# Patient Record
Sex: Female | Born: 2000 | Race: White | Hispanic: No | Marital: Single | Smoking: Never smoker
Health system: Southern US, Community
[De-identification: ages and names within clinical notes are randomized; demographics above are authoritative.]

## PROBLEM LIST (undated history)

## (undated) DIAGNOSIS — M1990 Unspecified osteoarthritis, unspecified site: Secondary | ICD-10-CM

## (undated) DIAGNOSIS — M419 Scoliosis, unspecified: Secondary | ICD-10-CM

## (undated) HISTORY — PX: ADENOIDECTOMY: SUR15

## (undated) HISTORY — DX: Unspecified osteoarthritis, unspecified site: M19.90

## (undated) HISTORY — DX: Scoliosis, unspecified: M41.9

## (undated) HISTORY — PX: TYMPANOSTOMY TUBE PLACEMENT: SHX32

---
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
## 2022-01-27 ENCOUNTER — Ambulatory Visit (INDEPENDENT_AMBULATORY_CARE_PROVIDER_SITE_OTHER): Payer: 59 | Admitting: Family Medicine

## 2022-01-27 ENCOUNTER — Encounter: Payer: Self-pay | Admitting: Family Medicine

## 2022-01-27 VITALS — BP 112/49 | Ht 68.0 in | Wt 168.0 lb

## 2022-01-27 DIAGNOSIS — M546 Pain in thoracic spine: Secondary | ICD-10-CM

## 2022-01-27 HISTORY — DX: Pain in thoracic spine: M54.6

## 2022-01-27 MED ORDER — METHOCARBAMOL 500 MG PO TABS: 500.0000 mg | ORAL_TABLET | Freq: Two times a day (BID) | ORAL | 0 refills | Status: AC | PRN

## 2022-01-27 NOTE — Assessment & Plan Note (Signed)
Patient likely has had a flare of her back pain.  We discussed that I would recommend continuing her home exercises and stretches.  New prescription for muscle relaxer, Robaxin sent today to try for the next 7 to 14 days.  She may also continue use of Tylenol and ibuprofen as needed.  I anticipate she will do well.  Discussed with her as she may need to incorporate her back exercises and to her daily routine once a week.  She verbalized understanding.  Return to clinic if symptoms worsen or fail to improve over the next couple weeks. If no improvement can consider formal physical therapy or other medications.

## 2022-01-27 NOTE — Patient Instructions (Signed)
Muscle strain, continue with your home exercises and stretches for the next 4 to 6 weeks.  New prescription for muscle relaxer, Robaxin sent to your pharmacy today.  Would caution if taking during the day as it may cause drowsiness.  You may also continue with Tylenol and ibuprofen as needed.  Ibuprofen not to exceed 2400 mg in a 24-hour time period.  Follow-up if symptoms worsen or do not improve over the next couple weeks.

## 2022-01-27 NOTE — Progress Notes (Signed)
   Established Patient Office Visit  Subjective   Patient ID: Vanessa Washington, female    DOB: May 10, 2001  Age: 21 y.o. MRN: [id number]  Back pain.  Ms. Vanessa Washington presents today with chief complaint of back pain that worsened on Monday and has been persistent.  She reports on Sunday she did a lot of sitting, sitting at church, sitting at lunch, sitting at the pool and then went home and laid in her bed without much other activity.  She has had similar back pain in the past that resolved with physical therapy.  She has not been doing her home exercises and stretches very consistently as her pain had improved.  She has tried ice, heat, Aleve and Tylenol with minimal relief of her pain.  She describes her pain as an achiness worse with rotation.  She denies any injury or trauma, numbness, tingling, radiation of her pain down her legs or loss of bowel or bladder incontinence.    Objective:     BP (!) 112/49   Ht 5\' 8"  (1.727 m)   Wt 168 lb (76.2 kg)   BMI 25.54 kg/m   Physical Exam Vitals reviewed.  Constitutional:      General: She is not in acute distress.    Appearance: Normal appearance. She is normal weight. She is not toxic-appearing or diaphoretic.  Pulmonary:     Effort: Pulmonary effort is normal.  Neurological:     Mental Status: She is alert.   Thoracic and lumbar spine: No obvious deformity or asymmetry.  No ecchymosis or swelling.  No tenderness to palpation midline thoracic or lumbar spine.  Muscle hypertonicity to paraspinals of lower thoracic and upper lumbar spine.  Full range of motion flexion, extension, rotation.  Worsening of pain with rotation bilaterally.  Strength 5/5 hip flexion, knee flexion and extension.  Patient is able to stand on tip toes and heels.  Normal gait    Assessment & Plan:   Problem List Items Addressed This Visit       Other   Acute bilateral thoracic back pain - Primary    Patient likely has had a flare of muscular back pain.  We  discussed that I would recommend continuing her home exercises and stretches.  New prescription for muscle relaxer, Robaxin sent today to try for the next 7 to 14 days as needed.  She may also continue use of Tylenol and ibuprofen as needed.  I anticipate she will do well.  Discussed with her as she may need to incorporate her back exercises and to her daily routine once a week.  She verbalized understanding.  Return to clinic if symptoms worsen or fail to improve over the next couple weeks. If no improvement can consider formal physical therapy or other medications.      Relevant Medications   methocarbamol (ROBAXIN) 500 MG tablet    Return in about 4 weeks (around 02/24/2022), or sooner if symptoms worsen or fail to improve.    02/26/2022, DO

## 2022-06-01 ENCOUNTER — Ambulatory Visit (INDEPENDENT_AMBULATORY_CARE_PROVIDER_SITE_OTHER): Payer: 59 | Admitting: Sports Medicine

## 2022-06-01 VITALS — BP 110/70 | Ht 68.0 in | Wt 160.0 lb

## 2022-06-01 DIAGNOSIS — M25511 Pain in right shoulder: Secondary | ICD-10-CM | POA: Diagnosis not present

## 2022-06-01 NOTE — Progress Notes (Addendum)
PCP: Clearnce Hasten, PA-C  Subjective:   HPI: Patient is a 21 y.o. female here for evaluation of right shoulder pain.  She reports that her symptoms started about 2 to 3 weeks ago when she noticed more frequent popping of her right shoulder.  A few days later she noticed shooting pain down her arms and the dorsal aspect of her hand but not into her fingers.  She also feels numbness down her entire arm and endorses weakness of her right arm.  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  She denies any trauma or injury.  She does not play any sports.  She has tried Tylenol without relief.  Her symptoms have not affected her sleep. Her symptoms are bearable and she declines any medications for her symptoms at this time.   Allergies  Allergen Reactions   Penicillin G Other (See Comments)    Childhood reaction   Sprintec 28 [Norgestimate-Eth Estradiol]    Amoxicillin Rash    BP 110/70   Ht 5\' 8"  (1.727 m)   Wt 160 lb (72.6 kg)   BMI 24.33 kg/m      07/16/2021    3:21 PM 08/27/2021    8:26 AM  [facility]  Frequency of aerobic exercise (# of days/week) 0 0  Average time in minutes 0 0  Frequency of strengthening activities (# of days/week) 0 0        No data to display             Objective:  Physical Exam:  Gen: NAD, comfortable in exam room  Neck: Negative Spurling, Lhermitte. Supple. No midline c-spine tenderness  Right arm: Normal ROM in supination, pronation. Normal strength. Negative Tinel at cubital tunnel  Right Shoulder: Inspection reveals no obvious deformity, atrophy, or asymmetry b/l. No bruising. No swelling Palpation is normal with no TTP over [facility] joint or bicipital groove b/l. Full ROM in flexion, abduction, internal rotation b/l. External rotation is slightly limited bilaterally NV intact distally  b/l Normal sensation to right arm when compared to left Special Tests:  - Impingement: Neg Hawkins, neers - Supraspinatous: Negative empty can - Infraspinatous/Teres Minor: 5/5 strength with ER - Subscapularis: 5/5 strength with IR - Labrum: Negative Obriens - No painful arc and no drop arm sign -1+ sulcus   Assessment & Plan:  1. Right Shoulder Pain Unclear etiology. Examination only notable for slight decreased ROM in external rotation and increased laxity. This may be the reason she has experienced popping. Exam is not consistent with cervical impingement or cubital tunnel syndrome. She has normal strength and sensation which is reassuring. Will treat conservatively and see how she progresses.  -X-ray right shoulder -Home exercises to strength rotator cuff  -F/u in 3 weeks if no improvement  Patient seen and evaluated with the resident.  I agree with the above plan of care.  Physical exam is reassuring today.  I suspect that she has some inflammation likely secondary to the repetitive nature of her job at [employer].  We did discuss this as being the reason for her symptoms.  I also think she has some mild underlying instability so we will treat with a home exercise program concentrating on strengthening of her internal rotators.  I will plan on checking an x-ray of her right shoulder and will follow-up with her  via MyChart with those results when available.  Will look for symptoms to improve rather quickly within the next 2 to 3 weeks but if this is not the case she will return to the office for reevaluation and consideration of further imaging.  This note was dictated using Dragon naturally speaking software and may contain errors in syntax, spelling, or content which have not been identified prior to signing this note.   Addendum: X-ray reviewed.  It is unremarkable.

## 2022-06-03 ENCOUNTER — Ambulatory Visit
Admission: RE | Admit: 2022-06-03 | Discharge: 2022-06-03 | Disposition: A | Discharge status: Home / Self Care | Payer: 59 | Source: Ambulatory Visit | Attending: Sports Medicine | Admitting: Sports Medicine

## 2022-06-03 ENCOUNTER — Encounter: Payer: Self-pay | Admitting: Radiology

## 2022-06-03 DIAGNOSIS — M25511 Pain in right shoulder: Secondary | ICD-10-CM

## 2022-06-07 ENCOUNTER — Encounter: Payer: Self-pay | Admitting: Sports Medicine

## 2022-07-27 ENCOUNTER — Encounter: Payer: Self-pay | Admitting: Adult Health

## 2022-07-27 ENCOUNTER — Ambulatory Visit: Payer: 59 | Admitting: Adult Health

## 2022-07-27 VITALS — BP 116/73 | HR 82 | Ht 68.0 in | Wt 164.0 lb

## 2022-07-27 DIAGNOSIS — F411 Generalized anxiety disorder: Secondary | ICD-10-CM | POA: Diagnosis not present

## 2022-07-27 MED ORDER — PROPRANOLOL HCL 10 MG PO TABS: 10.0000 mg | ORAL_TABLET | Freq: Three times a day (TID) | ORAL | 2 refills | Status: DC

## 2022-07-27 NOTE — Progress Notes (Signed)
[facility] MD/PA/NP Initial Note  07/27/2022 12:00 PM Vanessa Washington  MRN:  [id number]  Chief Complaint:   HPI:   Patient seen today for initial psychiatric evaluation.   Self referral.  Describes mood today as "ok". Pleasant. Denies tearfulness. Mood symptoms - reports depression - "sometimes". Feels anxious - "most all the time". Reports irritability - mostly due to anxiety. Reports worry, rumination, and over thinking. Reports decreased OCD behaviors - likes things to be "right". Denies irrational and intrusive thoughts. Has worked with a therapist in the past and learned so helpful strategies. Has taken Buspar for the past few years - uncertain if helpful. Willing to consider other options for situational anxiety. Stable interest and motivation. Taking medications as prescribed.  Energy levels stable. Active, does not have a regular exercise routine.  Enjoys some usual interests and activities. Single. Lives with her mom, grandmother and 2 brothers - 2 dogs and an outdoor cat. Spending time with family. Appetite adequate. Weight stable 164 pounds - 68". Sleeps well most nights. Averages 6 hours. Focus and concentration stable. Completing tasks. Managing aspects of household. Works at [employer] - 25 to 30 hours a week. 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  Denies AH or VH. Denies self harm. Denies substance use.  Previous medication trials: Buspar  Visit Diagnosis:    ICD-10-CM   1. Generalized anxiety disorder  F41.1       Past Psychiatric History: Denies psychiatric hospitalization.   Past Medical History:  Past Medical History:  Diagnosis Date   DJD (degenerative joint disease)    Scoliosis      Family Psychiatric History: Denies any family history of mental illness.   Family History: History reviewed. No pertinent family history.  Social History:  Social History   Socioeconomic History   Marital status: Single    Spouse  name: Not on file   Number of children: Not on file   Years of education: Not on file   Highest education level: Not on file  Occupational History   Not on file  Tobacco Use   Smoking status: Not on file   Smokeless tobacco: Not on file  Substance and Sexual Activity   Alcohol use: Not on file   Drug use: Not on file   Sexual activity: Not on file  Other Topics Concern   Not on file  Social History Narrative   Not on file   Social Determinants of Health   Financial Resource Strain: Not on file  Food Insecurity: Not on file  Transportation Needs: Not on file  Physical Activity: Not on file  Stress: Not on file  Social Connections: Not on file    Allergies:  Allergies  Allergen Reactions   Penicillin G Other (See Comments)    Childhood reaction   Sprintec 28 [Norgestimate-Eth Estradiol]    Amoxicillin Rash    Metabolic Disorder Labs: No results found for: "HGBA1C", "MPG" No results found for: "PROLACTIN" No results found for: "CHOL", "TRIG", "HDL", "CHOLHDL", "VLDL", "LDLCALC" No results found for: "TSH"  Therapeutic Level Labs: No results found for: "LITHIUM" No results found for: "VALPROATE" No results found for: "CBMZ"  Current Medications: No current outpatient medications on file.   No current facility-administered medications for this visit.    Medication Side Effects: none  Orders placed this visit:  No orders of the defined types were placed in this encounter.   Psychiatric Specialty Exam:  Review of Systems  Musculoskeletal:  Negative  for gait problem.  Neurological:  Negative for tremors.  Psychiatric/Behavioral:         Please refer to HPI    There were no vitals taken for this visit.There is no height or weight on file to calculate BMI.  General Appearance: Casual and Neat  Eye Contact:  Good  Speech:  Clear and Coherent and Normal Rate  Volume:  Normal  Mood:  Anxious  Affect:  Appropriate and Congruent  Thought Process:  Coherent  and Descriptions of Associations: Intact  Orientation:  Full (Time, Place, and Person)  Thought Content: Logical   Suicidal Thoughts:  No  Homicidal Thoughts:  No  Memory:  WNL  Judgement:  Good  Insight:  Good  Psychomotor Activity:  Normal  Concentration:  Concentration: Good and Attention Span: Good  Recall:  Good  Fund of Knowledge: Good  Language: Good  Assets:  Communication Skills Desire for Improvement Financial Resources/Insurance Housing Intimacy Leisure Time Physical Health Resilience Social Support Talents/Skills Transportation Vocational/Educational  ADL's:  Intact  Cognition: WNL  Prognosis:  Good   Screenings: MDQ  Receiving Psychotherapy: No   Treatment Plan/Recommendations:   Plan:  PDMP reviewed  Add Propranolol 10mg  TID for anxiety  Consider Hydroxyzine  RTC 4 weeks  Patient advised to contact office with any questions, adverse effects, or acute worsening in signs and symptoms.    Aloha Gell, NP

## 2022-07-29 ENCOUNTER — Ambulatory Visit (INDEPENDENT_AMBULATORY_CARE_PROVIDER_SITE_OTHER): Payer: 59 | Admitting: Behavioral Health

## 2022-07-29 DIAGNOSIS — F411 Generalized anxiety disorder: Secondary | ICD-10-CM | POA: Diagnosis not present
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
## 2022-08-02 ENCOUNTER — Encounter: Payer: Self-pay | Admitting: Behavioral Health

## 2022-08-11 ENCOUNTER — Other Ambulatory Visit: Payer: Self-pay

## 2022-08-11 MED ORDER — METHOCARBAMOL 500 MG PO TABS: 500.0000 mg | ORAL_TABLET | Freq: Two times a day (BID) | ORAL | 0 refills | Status: DC | PRN

## 2022-08-13 ENCOUNTER — Ambulatory Visit (INDEPENDENT_AMBULATORY_CARE_PROVIDER_SITE_OTHER): Payer: 59 | Admitting: Behavioral Health

## 2022-08-13 DIAGNOSIS — F411 Generalized anxiety disorder: Secondary | ICD-10-CM

## 2022-08-13 NOTE — Progress Notes (Signed)
[facility] Counselor/Therapist Progress Note  Patient ID: Vanessa Washington, MRN: [id number],    Date: 08/15/2022  Time Spent: 60 minutes  Treatment Type: Psychotherapy  Reported Symptoms: Anxiety  Mental Status Exam:  Appearance:   Casual     Behavior:  Appropriate and Sharing  Motor:  Normal  Speech/Language:   Clear and Coherent  Affect:  Appropriate and Congruent  Mood:  anxious  Thought process:  normal  Thought content:    WNL  Sensory/Perceptual disturbances:    WNL  Orientation:  oriented to person  Attention:  Good  Concentration:  Good  Memory:  WNL  Fund of knowledge:   Good  Insight:    Good  Judgment:   Good  Impulse Control:  Good   Risk Assessment: Danger to Self:  No Self-injurious Behavior: No Danger to Others: No Duty to Warn:no Physical Aggression / Violence:No  Access to Firearms a concern: No  Gang Involvement:No   Subjective:   The patient reports "I feel like I've been doing pretty good. I've had my moments but pretty good". She states she has had trouble with sleeping and being anxious about work. She reports she is compliant with her prescribed medication regimen. She reports her medication Propranolol is helpful. She rated her anxiety at a 4 intially and a 2.75 at the end of the session on a scale of 1 to 10 with 10 being severe. She reports being anxious today due to plans of going out of town this weekend she states "Just a little anxious about how this weekend is going to go". She states plans to attend a wedding this weekend. She reports having thoughts of "I am gonna be stuck in one room with them" that is creating anxiety for her. The patient reports to enjoy her alone time. 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    She reports what makes her anxious is "Being in charge of it all, I know how to do it. Its the big thing of being the one in charge and  having the confidence in myself". The patient reports triggers for anxiety as "Doing new things". She reports coping in the past by encouraging herself to overcome her anxiery and using positive self talk. The patient reports attempting to cope with her anxious thoughts by distracting her thoughts. She identified symptoms experienced when anxious as bitting her lips, becoming annoyed, fidgeting, increase heart rate, pressure in chest, tense muslces, avoidance, neverous tense and wound up, inpatient, and frustrated. The patient created a plan in session to assist her with coping with anxiety while out of town to include the following: utilizing distraction technique, relaxation techniques such as progressive muscle relaxation technique, coloring on the I Pad, homework, and watching a [employer].  The patient identified her take away from today's session as being "Eager to try the techniques". She denied SI/HI, AH/VH.   Counselor conducted check in. Counselor discussed mental health sympotms experienced and requested the patient rate her symptoms. Counselor provided assistance to the patient with processing in session her feelings of anxiety due to her report of planning to go out of town for the weekend with her family. Counselor provided assistance to the patient with developing a plan to assist her with managing her symptoms while she is out of town. Counselor educated the patient on thoughts/feelings/behaviors. Counselor educated the patient on monitoring her self talk.  Counselor questioned symptoms experienced that create anxiety and questioned triggers. Counselor educated the patient on relaxation technique coping strategies such as progressive muscle relaxation, deep breathing, and imagery. Counselor discussed how to challenge irrational thoughts and reframe automatic negative thoughts. Counselor provided the patient with psychoeducational material to assist her with implementing copings strategies  discussed in session today. Counselor assessed for SI/HI, AH/VH.    Interventions: Cognitive Behavioral Therapy  Diagnosis:   ICD-10-CM   1. Generalized anxiety disorder  F41.1       Plan:   Long Term Goal: Develop strategies to reduce symptoms. Short Term Goal: Reduce anxiety and improve coping skills. Objective: Develop strategies for thought distraction when fixating on the future. Objective: Learn two new ways of coping with routine stressors. Objective: Report feeling more positive about self and abilities during therapy sessions.       Jannifer Hick, [facility]

## 2022-08-15 ENCOUNTER — Encounter: Payer: Self-pay | Admitting: Behavioral Health

## 2022-08-24 ENCOUNTER — Ambulatory Visit (INDEPENDENT_AMBULATORY_CARE_PROVIDER_SITE_OTHER): Payer: 59 | Admitting: Adult Health

## 2022-08-24 ENCOUNTER — Encounter: Payer: Self-pay | Admitting: Adult Health

## 2022-08-24 DIAGNOSIS — F411 Generalized anxiety disorder: Secondary | ICD-10-CM

## 2022-08-24 MED ORDER — SERTRALINE HCL 50 MG PO TABS: 50.0000 mg | ORAL_TABLET | Freq: Every day | ORAL | 2 refills | Status: DC

## 2022-08-24 NOTE — Progress Notes (Signed)
Lorane Tomasino [id number] 08/08/2000 22 y.o.  Subjective:   Patient ID:  Vanessa Washington is a 22 y.o. (DOB 2000/10/02) female.  Chief Complaint: No chief complaint on file.   HPI Vanessa Washington presents to the office today for follow-up of GAD.  Describes mood today as "ok". Pleasant. Denies tearfulness. Mood symptoms - reports depression - "situational - missing her grandfather a lot". Feels anxious - "medications has helped reduce it - it's not all the time". Reports irritability - "sometimes". Reports worry, rumination, and over thinking - "I've always done that". Reports decreased OCD behaviors - "likes things to be how they are". Denies irrational and intrusive thoughts. Stating "I've been having some bad moments". Medications has helped with daily issues, but is willing to consider other options.  Stable interest and motivation. Taking medications as prescribed.  Energy levels stable. Active, does not have a regular exercise routine.  Enjoys some usual interests and activities. Single. Lives with her mom, grandmother and 2 brothers - 2 dogs and an outdoor cat. Spending time with family. Appetite adequate. Weight stable 164 pounds - 68". Sleeps well most nights. Averages 5 to 6 hours. Focus and concentration stable. Completing tasks. Managing aspects of household. Works at [employer] - 25 to 30 hours a week. 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  Denies AH or VH. Denies self harm. Denies substance use.  Previous medication trials: Buspar   MDI    Flowsheet Row Counselor from 07/29/2022 in [facility]  Total Score (max 50) 19        Review of Systems:  Review of Systems  Musculoskeletal:  Negative for gait problem.  Neurological:  Negative for tremors.  Psychiatric/Behavioral:         Please refer to HPI    Medications: I have reviewed the patient's current medications.  Current Outpatient  Medications  Medication Sig Dispense Refill   sertraline (ZOLOFT) 50 MG tablet Take 1 tablet (50 mg total) by mouth daily. 30 tablet 2   methocarbamol (ROBAXIN) 500 MG tablet Take 1 tablet (500 mg total) by mouth 2 (two) times daily as needed for muscle spasms. 60 tablet 0   norgestimate-ethinyl estradiol (ORTHO-CYCLEN) 0.25-35 MG-MCG tablet Take 1 tablet by mouth daily.     propranolol (INDERAL) 10 MG tablet Take 1 tablet (10 mg total) by mouth 3 (three) times daily. 90 tablet 2   No current facility-administered medications for this visit.    Medication Side Effects: None  Allergies:  Allergies  Allergen Reactions   Penicillin G Other (See Comments)    Childhood reaction  Unknown   Amoxicillin Rash    Past Medical History:  Diagnosis Date   DJD (degenerative joint disease)    Scoliosis     Past Medical History, Surgical history, Social history, and Family history were reviewed and updated as appropriate.   Please see review of systems for further details on the patient's review from today.   Objective:   Physical Exam:  There were no vitals taken for this visit.  Physical Exam Constitutional:      General: She is not in acute distress. Musculoskeletal:        General: No deformity.  Neurological:     Mental Status: She is alert and oriented to person, place, and time.     Coordination: Coordination normal.  Psychiatric:        Attention and Perception: Attention and perception normal. She does not perceive auditory  or visual hallucinations.        Mood and Affect: Mood normal. Mood is not anxious or depressed. Affect is not labile, blunt, angry or inappropriate.        Speech: Speech normal.        Behavior: Behavior normal.        Thought Content: Thought content normal. Thought content is not paranoid or delusional. Thought content does not include homicidal or suicidal ideation. Thought content does not include homicidal or suicidal plan.        Cognition and  Memory: Cognition and memory normal.        Judgment: Judgment normal.     Comments: Insight intact     Lab Review:  No results found for: "NA", "K", "CL", "CO2", "GLUCOSE", "BUN", "CREATININE", "CALCIUM", "PROT", "ALBUMIN", "AST", "ALT", "ALKPHOS", "BILITOT", "GFRNONAA", "GFRAA"  No results found for: "WBC", "RBC", "HGB", "HCT", "PLT", "MCV", "MCH", "MCHC", "RDW", "LYMPHSABS", "MONOABS", "EOSABS", "BASOSABS"  No results found for: "POCLITH", "LITHIUM"   No results found for: "PHENYTOIN", "PHENOBARB", "VALPROATE", "CBMZ"   .res Assessment: Plan:    Plan:  PDMP reviewed  Continue Propranolol '10mg'$  TID for anxiety  Add Zoloft '50mg'$  daily  Consider Hydroxyzine  RTC 4 weeks  Time spent with patient was 15 minutes. Greater than 50% of face to face time with patient was spent on counseling and coordination of care.    Patient advised to contact office with any questions, adverse effects, or acute worsening in signs and symptoms.   Diagnoses and all orders for this visit:  Generalized anxiety disorder -     sertraline (ZOLOFT) 50 MG tablet; Take 1 tablet (50 mg total) by mouth daily.     Please see After Visit Summary for patient specific instructions.  Future Appointments  Date Time Provider [place]  08/31/2022  1:00 PM Jannifer Hick [facility] CP-CP None  09/14/2022 11:00 AM Jannifer Hick, [facility] None  09/28/2022 11:00 AM Jannifer Hick, [facility] CP-CP None    No orders of the defined types were placed in this encounter.   -------------------------------

## 2022-08-31 ENCOUNTER — Ambulatory Visit (INDEPENDENT_AMBULATORY_CARE_PROVIDER_SITE_OTHER): Payer: 59 | Admitting: Behavioral Health

## 2022-08-31 DIAGNOSIS — F411 Generalized anxiety disorder: Secondary | ICD-10-CM | POA: Diagnosis not present

## 2022-08-31 NOTE — Progress Notes (Signed)
[facility] Counselor/Therapist Progress Note  Patient ID: Vanessa Washington, MRN: [id number],    Date: 08/31/2022  Time Spent: 60 minutes  Treatment Type: Individual Therapy  Reported Symptoms: Anxiety due to experiencing grief.   Mental Status Exam:  Appearance:   Casual     Behavior:  Appropriate and Sharing  Motor:  Normal  Speech/Language:   Clear and Coherent  Affect:  Appropriate and Congruent  Mood:  sad  Thought process:  normal  Thought content:    WNL  Sensory/Perceptual disturbances:    WNL  Orientation:  oriented to person  Attention:  Good  Concentration:  Good  Memory:  WNL  Fund of knowledge:   Good  Insight:    Good  Judgment:   Good  Impulse Control:  Good   Risk Assessment: Danger to Self:  No Self-injurious Behavior: No Danger to Others: No Duty to Warn:no Physical Aggression / Violence:No  Access to Firearms a concern: No  Gang Involvement:No   Subjective:   The patient states since last being seen by this counselor "The first week was kind of rough, but this past week has been better". She reports she is medication compliant and states recently starting a new medication Zoloft and reports to take Propranolol as needed.  She reported no current side effects experienced. She reports her trip went well she expressed gratitude with seeing a family member she hadn't seen in a while. The patient reports experiencing anxiety due to missing her deceased grandfather. She identified triggers of sadness as talking about him and listening to music he used to like. She identified fond memories of him and reflected on the past. The patient states she is unaware when she is the saddest. She reports what she missed the most about her grandfather as "Just him being there, because I would always hang out with him. He brought our family close together because he was the mediator". The patient reports honoring her deceased grandfather by burying him in the urn  garden he created. 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   She reports to cope with his death by using humor. The patient expressed gratitude with being proud of the person her grandfather was and the person he made her. She identified current emotions as "A little lonely, a light sadness, mad at the fact he died, feeling quiet". The patient reports belief that she is in the acceptance stage of her grandfathers death. The patient reports "I have days that I am depressed about it, but not a constant state". The patient reports her current trigger for missing her grandfather is she is about to graduate with her Associates Degree in [employer] and Business.  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 The patient reports "Its a sadness I feel, but I can still function". She identified what she would never forget about her grandfather she identified him as her father figure. The patient identified her take away from today's session as "I know I can be sad, but just trying to remember the good moments when I get sad ". The patient was informed this counselor's time is limited at [place] the patient requested to continue with therapy with another staff after this counselor leave. Patient denied SI/HI, AH/VH.   Counselor conducted check in. Counselor discussed mental health symptoms experienced. Counselor utilized reflective listening and validated the patients  concerns while she reminisced on missing her deceased grandfather. Counselor discussed the stages of grief and requested feedback from the patient on what stage she is in. Counselor facilitated an activity in session and requested the patient identify her current emotions on a Feelings Wheel. Counselor informed the patient her time is limited with [facility] and questioned the patients interest with continuing with  therapy. Counselor assessed for SI/HI,AH/VH.     Interventions: Grief Therapy  Diagnosis:   ICD-10-CM   1. Generalized anxiety disorder  F41.1       Plan:   Long Term Goal: Develop strategies to reduce symptoms. Short Term Goal: Reduce anxiety and improve coping skills. Objective: Develop strategies for thought distraction when fixating on the future. Objective: Learn two new ways of coping with routine stressors. Objective: Report feeling more positive about self and abilities during therapy sessions.      Jannifer Hick, [facility]

## 2022-09-02 ENCOUNTER — Encounter: Payer: Self-pay | Admitting: Behavioral Health

## 2022-09-14 ENCOUNTER — Ambulatory Visit (INDEPENDENT_AMBULATORY_CARE_PROVIDER_SITE_OTHER): Payer: 59 | Admitting: Behavioral Health

## 2022-09-14 ENCOUNTER — Encounter: Payer: Self-pay | Admitting: Behavioral Health

## 2022-09-14 DIAGNOSIS — F411 Generalized anxiety disorder: Secondary | ICD-10-CM | POA: Diagnosis not present

## 2022-09-14 NOTE — Progress Notes (Signed)
      [facility] Counselor/Therapist Progress Note  Patient ID: Vanessa Washington, MRN: [id number],    Date: 09/14/2022  Time Spent: 60 minutes   Treatment Type: Individual Therapy  Reported Symptoms:  Anxiety   Mental Status Exam:  Appearance:   Casual     Behavior:  Appropriate and Sharing  Motor:  Normal  Speech/Language:   Clear and Coherent  Affect:  Appropriate, Congruent, and Tearful  Mood:  anxious and sad  Thought process:  normal  Thought content:    WNL  Sensory/Perceptual disturbances:    WNL  Orientation:  oriented to person  Attention:  Good  Concentration:  Good  Memory:  WNL  Fund of knowledge:   Good  Insight:    Good  Judgment:   Good  Impulse Control:  Good   Risk Assessment: Danger to Self:  No Self-injurious Behavior: No Danger to Others: No Duty to Warn:no Physical Aggression / Violence:No  Access to Firearms a concern: No  Gang Involvement:No   Subjective:   The patient reports doing "Pretty good" since last being seen. The patient expressed interest in discussing a current family issue. The patient discussed concerns of a custody dispute involving her younger sibling. She reports experiencing anxiety due to what is happening. She identified physical symptoms of anxiety as tightness of her chest. She reports feeling frustrated with the custody dispute and having belief that she is unable to help as she desires. The patient reports feeling mistreated by her moms ex-husband. The patient reports having knowledge that what her mother's ex-husband say about her is not true. She states concerns that he is attempting to damage her relationship with her younger brother. The patient states a child protective services (CPS) report has been filed in the past based off of false allegations. The patient reports interest with establishing a relationship with her younger brother in regards to  "I don't want him to hate me". She reports belief that in order for  her relationship to improve with her younger brother he will need to grow older. She rated her anxiety at a 5 today on a scale of 1 to 10 with 10 being severe. The patient denied SI/HI, AH/VH. The patient reported interest with continuing to receive therapy with [facility] and was informed what therapist she will transition to. The patient was receptive.   Counselor conducted check in. Counselor discussed mental health symptoms experienced and requested for the patient to rate the symptoms. Counselor explored what factual evidence did the patient have to conclude her relationship with her younger sibling is being damaged by his father. Counselor discussed cognitive distortions and educated the patient on emotional reasoning. Counselor utilized reflective listening and validated the patient. Counselor provided assistance to the patient with exploring her family dynamics. Counselor questioned the patients interest with improving her relationship with her younger sibling. Counselor assessed for SI/HI, AH/VH.  Interventions: Motivational Interviewing  Diagnosis:   ICD-10-CM   1. Generalized anxiety disorder  F41.1       Plan:   Long Term Goal: Develop strategies to reduce symptoms. Short Term Goal: Reduce anxiety and improve coping skills. Objective: Develop strategies for thought distraction when fixating on the future. Objective: Learn two new ways of coping with routine stressors. Objective: Report feeling more positive about self and abilities during therapy sessions.      Jannifer Hick, [facility]

## 2022-09-15 ENCOUNTER — Other Ambulatory Visit: Payer: Self-pay | Admitting: Adult Health

## 2022-09-15 DIAGNOSIS — F411 Generalized anxiety disorder: Secondary | ICD-10-CM

## 2022-09-23 ENCOUNTER — Ambulatory Visit (INDEPENDENT_AMBULATORY_CARE_PROVIDER_SITE_OTHER): Payer: Self-pay | Admitting: Adult Health

## 2022-09-23 DIAGNOSIS — F489 Nonpsychotic mental disorder, unspecified: Secondary | ICD-10-CM

## 2022-09-23 NOTE — Progress Notes (Signed)
Patient no show appointment. ? ?

## 2022-09-27 ENCOUNTER — Encounter: Payer: Self-pay | Admitting: Adult Health

## 2022-09-27 ENCOUNTER — Ambulatory Visit (INDEPENDENT_AMBULATORY_CARE_PROVIDER_SITE_OTHER): Payer: 59 | Admitting: Adult Health

## 2022-09-27 DIAGNOSIS — F411 Generalized anxiety disorder: Secondary | ICD-10-CM

## 2022-09-27 MED ORDER — SERTRALINE HCL 50 MG PO TABS: 50.0000 mg | ORAL_TABLET | Freq: Every day | ORAL | 2 refills | Status: DC

## 2022-09-27 NOTE — Progress Notes (Signed)
Shadell Stewart [id number] 04-06-2001 22 y.o.  Subjective:   Patient ID:  Vanessa Washington is a 22 y.o. (DOB 07-13-2000) female.  Chief Complaint: No chief complaint on file.   HPI Vanessa Washington presents to the office today for follow-up of GAD.  Describes mood today as "ok". Pleasant. Denies tearfulness. Mood symptoms - reports decreased depression Reports decreased anxiety with addition of Zoloft. Denies irritability. Reports decreased worry, rumination, and over thinking. Reports decreased OCD behaviors. Denies irrational and intrusive thoughts. Stating "I've been feeling better". Feels like the addition of Zoloft has been helpful. Stable interest and motivation. Taking medications as prescribed.  Energy levels stable. Active, does not have a regular exercise routine.  Enjoys some usual interests and activities. Single. Lives with her mom, grandmother and 2 brothers - 2 dogs and an outdoor cat. Spending time with family. Appetite adequate. Weight stable 164 pounds - 68". Sleeps well most nights. Averages 5 to 6 hours. Focus and concentration stable. Completing tasks. Managing aspects of household. Works at [employer] - 25 to 30 hours a week. 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  Denies AH or VH. Denies self harm. Denies substance use.   MDI    Flowsheet Row Counselor from 07/29/2022 in [facility]  Total Score (max 50) 19        Review of Systems:  Review of Systems  Musculoskeletal:  Negative for gait problem.  Neurological:  Negative for tremors.  Psychiatric/Behavioral:         Please refer to HPI    Medications: I have reviewed the patient's current medications.  Current Outpatient Medications  Medication Sig Dispense Refill   methocarbamol (ROBAXIN) 500 MG tablet Take 1 tablet (500 mg total) by mouth 2 (two) times daily as needed for muscle spasms. 60  tablet 0   norgestimate-ethinyl estradiol (ORTHO-CYCLEN) 0.25-35 MG-MCG tablet Take 1 tablet by mouth daily.     propranolol (INDERAL) 10 MG tablet Take 1 tablet (10 mg total) by mouth 3 (three) times daily. 90 tablet 2   sertraline (ZOLOFT) 50 MG tablet Take 1 tablet (50 mg total) by mouth daily. 30 tablet 2   No current facility-administered medications for this visit.    Medication Side Effects: None  Allergies:  Allergies  Allergen Reactions   Penicillin G Other (See Comments)    Childhood reaction  Unknown   Amoxicillin Rash    Past Medical History:  Diagnosis Date   DJD (degenerative joint disease)    Scoliosis     Past Medical History, Surgical history, Social history, and Family history were reviewed and updated as appropriate.   Please see review of systems for further details on the patient's review from today.   Objective:   Physical Exam:  There were no vitals taken for this visit.  Physical Exam Constitutional:      General: She is not in acute distress. Musculoskeletal:        General: No deformity.  Neurological:     Mental Status: She is alert and oriented to person, place, and time.     Coordination: Coordination normal.  Psychiatric:        Attention and Perception: Attention and perception normal. She does not perceive auditory or visual hallucinations.        Mood and Affect: Mood normal. Mood is not anxious or depressed. Affect is not labile, blunt, angry or inappropriate.  Speech: Speech normal.        Behavior: Behavior normal.        Thought Content: Thought content normal. Thought content is not paranoid or delusional. Thought content does not include homicidal or suicidal ideation. Thought content does not include homicidal or suicidal plan.        Cognition and Memory: Cognition and memory normal.        Judgment: Judgment normal.     Comments: Insight intact     Lab Review:  No results found for: "NA", "K", "CL", "CO2",  "GLUCOSE", "BUN", "CREATININE", "CALCIUM", "PROT", "ALBUMIN", "AST", "ALT", "ALKPHOS", "BILITOT", "GFRNONAA", "GFRAA"  No results found for: "WBC", "RBC", "HGB", "HCT", "PLT", "MCV", "MCH", "MCHC", "RDW", "LYMPHSABS", "MONOABS", "EOSABS", "BASOSABS"  No results found for: "POCLITH", "LITHIUM"   No results found for: "PHENYTOIN", "PHENOBARB", "VALPROATE", "CBMZ"   .res Assessment: Plan:    Plan:  PDMP reviewed  Propranolol 10mg  TID for anxiety  Zoloft 50mg  daily  Consider Hydroxyzine  RTC 3 months  Time spent with patient was 15 minutes. Greater than 50% of face to face time with patient was spent on counseling and coordination of care.    Patient advised to contact office with any questions, adverse effects, or acute worsening in signs and symptoms.  There are no diagnoses linked to this encounter.   Please see After Visit Summary for patient specific instructions.  Future Appointments  Date Time Provider [place]  09/28/2022 11:00 AM Jannifer Hick, [facility] CP-CP None    No orders of the defined types were placed in this encounter.   -------------------------------

## 2022-09-28 ENCOUNTER — Ambulatory Visit (INDEPENDENT_AMBULATORY_CARE_PROVIDER_SITE_OTHER): Payer: 59 | Admitting: Behavioral Health

## 2022-09-28 ENCOUNTER — Encounter: Payer: Self-pay | Admitting: Behavioral Health

## 2022-09-28 DIAGNOSIS — F411 Generalized anxiety disorder: Secondary | ICD-10-CM | POA: Diagnosis not present

## 2022-09-28 NOTE — Progress Notes (Signed)
[facility] Counselor/Therapist Progress Note  Patient ID: Vanessa Washington, MRN: [id number],    Date: 09/28/2022  Time Spent: 40 minutes  Treatment Type: Individual Therapy  Reported Symptoms: Mild anxiety  Mental Status Exam:  Appearance:   Casual     Behavior:  Appropriate and Sharing  Motor:  Normal  Speech/Language:   Clear and Coherent  Affect:  Appropriate and Congruent  Mood:  normal  Thought process:  normal  Thought content:    WNL  Sensory/Perceptual disturbances:    WNL  Orientation:  oriented to person  Attention:  Good  Concentration:  Good  Memory:  WNL  Fund of knowledge:   Good  Insight:    Good  Judgment:   Good  Impulse Control:  Good   Risk Assessment: Danger to Self:  No Self-injurious Behavior: No Danger to Others: No Duty to Warn:no Physical Aggression / Violence:No  Access to Firearms a concern: No  Gang Involvement:No   Subjective:   The patient presented with cooperative behavior during this session. The patient reports doing good since last being seen. She reports adhering to her prescribed medication regimen and states the medicine is helpful. She denied experiencing any side effects. She reports daily medication prescribed is Zoloft and Propranolol as needed. She rated anxiety at a 1 today on a scale of 0 to 10 with 10 being severe. The patient states "Since I started taking the medicine I haven't really had any anxiety. I have been doing really good". The patient states her family dynamics have improved since she has began following her medication regimen. She reports things went well when she spent time with her family for the Easter holiday.  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 She expressed gratitude with observing her progress in life. She reports receiving management training to help her adjust into her new role. She reports utilizing positive self talk to assist her with managing her mood stability. She  expressed gratitude with soon graduating with her Associates Degree in [employer] from [employer]. She identified a plan for herself regarding her future as learning the funeral home business. She identified having insight and knowledge on what her interest are in having a career. She denied SI/HI, AH/VH. The patient states interest with continuing to receive therapy with [facility]. She is receptive of the therapist she will transition to she states to have a future appointment scheduled that she plans to attend.  Counselor conducted check in. Counselor utilized open ended questions, reflective listening and validated the patient. Counselor discussed mental health symptoms experienced. Counselor requested for the patient to rate her symptoms. Counselor questioned medication compliance and side effects experienced. Counselor reviewed the patients therapeutic goal for treatment and discussed her progress with implementing effective changes. Counselor discussed the patients interest with continuing to receive therapy due to this counselor will be leaving [facility]. Counselor assessed for SI/HI, AH/VH.    Interventions: Motivational Interviewing  Diagnosis:   ICD-10-CM   1. Generalized anxiety disorder  F41.1       Plan:   Long Term Goal: Develop strategies to reduce symptoms. Short Term Goal: Reduce anxiety and improve coping skills. Objective: Develop strategies for thought distraction when fixating on the future. Objective: Learn two new ways of coping with routine stressors. Objective: Report feeling more positive about self and abilities during therapy sessions.     Jannifer Hick, [facility]

## 2022-10-12 ENCOUNTER — Ambulatory Visit (INDEPENDENT_AMBULATORY_CARE_PROVIDER_SITE_OTHER): Payer: 59 | Admitting: Psychiatry

## 2022-10-12 DIAGNOSIS — F411 Generalized anxiety disorder: Secondary | ICD-10-CM | POA: Diagnosis not present

## 2022-10-12 NOTE — Progress Notes (Signed)
[facility] Counselor/Therapist Progress Note  Patient ID: Vanessa Washington, MRN: [id number],    Date: 10/12/2022  Time Spent: 50 minutes   Treatment Type: Individual Therapy  Reported Symptoms: anxiety, some unresolved family issues from past, denies any SI  Mental Status Exam:  Appearance:   Casual     Behavior:  Appropriate, Sharing, and Motivated  Motor:  Normal  Speech/Language:   Clear and Coherent  Affect:  anxious  Mood:  anxious  Thought process:  goal directed  Thought content:    WNL  Sensory/Perceptual disturbances:    WNL  Orientation:  oriented to person, place, time/date, situation, day of week, month of year, year, and stated date of October 12, 2022  Attention:  Good  Concentration:  Good  Memory:  WNL  Fund of knowledge:   Good  Insight:    Good  Judgment:   Good  Impulse Control:  Good   Risk Assessment: Danger to Self:  No Self-injurious Behavior: No Danger to Others: No Duty to Warn:no Physical Aggression / Violence:No  Access to Firearms a concern: No  Gang Involvement:No   Subjective:   Patient transferred from previous therapist who was moving away and left practice. States "My main therapy goal is to manage my anxiety better". Was in therapy and made a bit of progress but was evaluated for meds and is now doing better on the Zoloft. Works a difficult schedule getting up at 3:15 and had to be at work by 5:15am and gets off at 2:15pm. Also has to close on some days and hours are adjusted. Just completing [employer] (Agriculture/business but not sure what field she really wants to go into as a career.) Exhausted with school and wants a break to decide what's next steps for her. Feels meds have helped but still wanting to work in therapy mainly "about the relationship with my dad and why he left the family, me and my siblings". "When I was little I was mad at him but now am not mad but just wonder a lot, and I really don't see  him as my dad." Wishes she could talk with dad about some things but not our of anger. Unsure how or when she wants to do that. Has been close to a former school admin and her husband/minister at that time.  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  Patient does show good insight into her situation and her life experiences and wanting to participate in therapy to talk through more these and especially issues relating to her father that had left the family earlier in his marriage to their mother.  Based on her work schedule, she is scheduling within the month to return for her next session.  Will call before that time if needed.  Interventions: Cognitive Behavioral Therapy and Ego-Supportive   Long Term Goal: Develop strategies to reduce symptoms. Short Term Goal: Reduce anxiety and improve coping skills. Objective: Develop strategies for thought distraction when fixating on the future. Objective: Learn two new ways of coping with routine stressors. Objective: Report feeling more positive about self and abilities during therapy sessions.   Diagnosis:   ICD-10-CM   1. Generalized anxiety disorder  F41.1      Plan: Patient attended session today as she is transitioning from a previous therapist who was relocating to another city.  Patient very open today and sharing  a lot of issues from her past and her present, and picking up especially on some issues that are related to her father leaving the family in earlier years as parents divorced and also some current anxieties regarding work and potentially choosing a vocation of interest to her.  Seems especially has some further issues to work out regarding her biological father.  Does feel that her medication is helping her with her anxiety.  Is motivated and scheduling a return appointment today and we will follow-up with patient at that time.  Goal review and  progress/challenges noted with patient.  Next appointment within 2 to 3 weeks.  This record has been created using [employer].  Chart creation errors have been sought, but may not always have been located and corrected.  Such creation errors do not reflect on the standard of medical care provided.   Mathis Fare, LCSW

## 2022-10-15 ENCOUNTER — Encounter (HOSPITAL_BASED_OUTPATIENT_CLINIC_OR_DEPARTMENT_OTHER): Payer: Self-pay

## 2022-10-15 ENCOUNTER — Emergency Department (HOSPITAL_BASED_OUTPATIENT_CLINIC_OR_DEPARTMENT_OTHER)
Admission: EM | Admit: 2022-10-15 | Discharge: 2022-10-15 | Disposition: A | Discharge status: Home / Self Care | Payer: 59 | Attending: Emergency Medicine | Admitting: Emergency Medicine

## 2022-10-15 ENCOUNTER — Emergency Department (HOSPITAL_BASED_OUTPATIENT_CLINIC_OR_DEPARTMENT_OTHER): Payer: 59

## 2022-10-15 ENCOUNTER — Other Ambulatory Visit: Payer: Self-pay

## 2022-10-15 DIAGNOSIS — R079 Chest pain, unspecified: Secondary | ICD-10-CM | POA: Diagnosis present

## 2022-10-15 MED ORDER — OMEPRAZOLE 20 MG PO CPDR: 20.0000 mg | DELAYED_RELEASE_CAPSULE | Freq: Every day | ORAL | 1 refills | Status: DC

## 2022-10-15 MED ORDER — ALUM & MAG HYDROXIDE-SIMETH 200-200-20 MG/5ML PO SUSP
30.0000 mL | Freq: Once | ORAL | Status: AC
  Administered 2022-10-15: 30 mL via ORAL
  Filled 2022-10-15: qty 30

## 2022-10-15 MED ORDER — SUCRALFATE 1 G PO TABS: 1.0000 g | ORAL_TABLET | Freq: Four times a day (QID) | ORAL | 0 refills | Status: DC | PRN

## 2022-10-15 MED ORDER — ONDANSETRON 4 MG PO TBDP
4.0000 mg | ORAL_TABLET | Freq: Once | ORAL | Status: AC
  Administered 2022-10-15: 4 mg via ORAL
  Filled 2022-10-15: qty 1

## 2022-10-15 NOTE — ED Triage Notes (Signed)
Woke up ~1 hour pta with sudden generalized chest pains described as indigestion like pains.

## 2022-10-15 NOTE — Discharge Instructions (Signed)
You were evaluated in the Emergency Department and after careful evaluation, we did not find any emergent condition requiring admission or further testing in the hospital.  Your exam/testing today is overall reassuring.  Suspect your symptoms are related to acid reflux.  Take the omeprazole and Carafate medications as we discussed.  We talked about your EKG findings.  Please follow-up with cardiology for further testing or evaluation.  Please return to the Emergency Department if you experience any worsening of your condition.   Thank you for allowing [place] to be a part of your care.

## 2022-10-15 NOTE — ED Provider Notes (Signed)
[facility] Emergency Department Provider Note MRN:  [id number]  Arrival date & time: 10/15/22     Chief Complaint   Chest Pain   History of Present Illness   Vanessa Washington is a 22 y.o. year-old female with no pertinent past medical history presenting to the ED with chief complaint of chest pain.  Central burning chest pain with radiation into the throat.  Woke her up from sleep.  No shortness of breath, no recent leg pain or swelling, no abdominal pain  Review of Systems  A thorough review of systems was obtained and all systems are negative except as noted in the HPI and PMH.   Patient's Health History    Past Medical History:  Diagnosis Date   DJD (degenerative joint disease)    Scoliosis     History reviewed. No pertinent surgical history.  History reviewed. No pertinent family history.  Social History   Socioeconomic History   Marital status: Single    Spouse name: Not on file   Number of children: Not on file   Years of education: Not on file   Highest education level: Not on file  Occupational History   Not on file  Tobacco Use   Smoking status: Never   Smokeless tobacco: Never  Substance and Sexual Activity   Alcohol use: Never   Drug use: Never   Sexual activity: Not on file  Other Topics Concern   Not on file  Social History Narrative   Not on file   Social Determinants of Health   Financial Resource Strain: Not on file  Food Insecurity: Not on file  Transportation Needs: Not on file  Physical Activity: Not on file  Stress: Not on file  Social Connections: Not on file  Intimate Partner Violence: Not on file     Physical Exam   Vitals:   10/15/22 0400  BP: 129/76  Pulse: 95  Resp: (!) 22  Temp: 98.3 F (36.8 C)  SpO2: 100%    CONSTITUTIONAL: Well-appearing, NAD NEURO/PSYCH:  Alert and oriented x 3, no focal deficits EYES:  eyes equal and reactive ENT/NECK:  no LAD, no JVD CARDIO: Regular rate,  well-perfused, normal S1 and S2 PULM:  CTAB no wheezing or rhonchi GI/GU:  non-distended, non-tender MSK/SPINE:  No gross deformities, no edema SKIN:  no rash, atraumatic   *Additional and/or pertinent findings included in MDM below  Diagnostic and Interventional Summary    EKG Interpretation  Date/Time:  Friday October 15 2022 04:38:23 EDT Ventricular Rate:  75 PR Interval:  152 QRS Duration: 121 QT Interval:  371 QTC Calculation: 415 R Axis:   53 Text Interpretation: Sinus rhythm Probable left atrial enlargement Right bundle branch block Confirmed by Kennis Carina [phone]) on 10/15/2022 4:44:21 AM       Labs Reviewed - No data to display  DG Chest 2 View  Final Result      Medications  alum & mag hydroxide-simeth (MAALOX/MYLANTA) 200-200-20 MG/5ML suspension 30 mL (30 mLs Oral Given 10/15/22 0415)  ondansetron (ZOFRAN-ODT) disintegrating tablet 4 mg (4 mg Oral Given 10/15/22 0415)     Procedures  /  Critical Care Procedures  ED Course and Medical Decision Making  Initial Impression and Ddx Burning pain while sleeping radiation into the throat, favoring acid reflux.  Other considerations include pneumothorax.  Nothing to suggest PE.  Past medical/surgical history that increases complexity of ED encounter: None  Interpretation of Diagnostics I personally reviewed the EKG and my interpretation  is as follows: Sinus rhythm, right bundle branch block without prior for comparison  Chest x-ray unremarkable.  Patient Reassessment and Ultimate Disposition/Management     Patient with significant relief after GI cocktail.  Highly doubt emergent process such as PE or ACS or dissection.  Doubt that the bundle branch block is an acute issue.  It does not seem consistent with Brugada or any other dangerous arrhythmia.  Still given the bundle branch block in a 22 year old, will refer to cardiology for possible echo.  Patient management required discussion with the following services  or consulting groups:  None  Complexity of Problems Addressed Acute complicated illness or Injury  Additional Data Reviewed and Analyzed Further history obtained from: Further history from spouse/family member  Additional Factors Impacting ED Encounter Risk Prescriptions  Elmer Sow. Pilar Plate, MD [facility] Emergency Medicine [facility] [email or web address]  Final Clinical Impressions(s) / ED Diagnoses     ICD-10-CM   1. Chest pain, unspecified type  R07.9 Ambulatory referral to Cardiology      ED Discharge Orders          Ordered    Ambulatory referral to Cardiology        10/15/22 0510    sucralfate (CARAFATE) 1 g tablet  4 times daily PRN        10/15/22 0510    omeprazole (PRILOSEC) 20 MG capsule  Daily        10/15/22 0510             Discharge Instructions Discussed with and Provided to Patient:     Discharge Instructions      You were evaluated in the Emergency Department and after careful evaluation, we did not find any emergent condition requiring admission or further testing in the hospital.  Your exam/testing today is overall reassuring.  Suspect your symptoms are related to acid reflux.  Take the omeprazole and Carafate medications as we discussed.  We talked about your EKG findings.  Please follow-up with cardiology for further testing or evaluation.  Please return to the Emergency Department if you experience any worsening of your condition.   Thank you for allowing [place] to be a part of your care.       Sabas Sous, MD 10/15/22 [phone]

## 2022-10-21 ENCOUNTER — Other Ambulatory Visit: Payer: Self-pay | Admitting: Adult Health

## 2022-10-21 DIAGNOSIS — F411 Generalized anxiety disorder: Secondary | ICD-10-CM

## 2022-10-26 ENCOUNTER — Ambulatory Visit: Payer: 59 | Admitting: Psychiatry

## 2022-11-09 ENCOUNTER — Ambulatory Visit (INDEPENDENT_AMBULATORY_CARE_PROVIDER_SITE_OTHER): Payer: 59 | Admitting: Psychiatry

## 2022-11-09 DIAGNOSIS — F411 Generalized anxiety disorder: Secondary | ICD-10-CM

## 2022-11-09 NOTE — Progress Notes (Signed)
[facility] Counselor/Therapist Progress Note  Patient ID: Vanessa Washington, MRN: [id number],    Date: 11/09/2022  Time Spent: 55 minutes   Treatment Type: Individual Therapy  Reported Symptoms: anxiety (improving)  Mental Status Exam:  Appearance:   Casual     Behavior:  Appropriate, Sharing, and Motivated  Motor:  Normal  Speech/Language:   Clear and Coherent  Affect:  anxious  Mood:  anxious  Thought process:  goal directed  Thought content:    WNL  Sensory/Perceptual disturbances:    WNL  Orientation:  oriented to person, place, time/date, situation, day of week, month of year, year, and stated date of Nov 09, 2022  Attention:  Good  Concentration:  Good  Memory:  WNL  Fund of knowledge:   Good  Insight:    Good  Judgment:   Good  Impulse Control:  Good   Risk Assessment: Danger to Self:  No Self-injurious Behavior: No Danger to Others: No Duty to Warn:no Physical Aggression / Violence:No  Access to Firearms a concern: No  Gang Involvement:No   Subjective:   Patient in session today reporting anxiety and it is improving. Graduated from college since last appt. Did see her dad who she does not live with and has has strained relationship. Dad left afterwards and he and GF went to the beach. Not much time spent with him and he may have not felt comfortable. Have always felt mad or sad that Dad wasn't really involved with her life very actively, and "not really sure how I feel about him or all of the times of him not being there for me much." Processed a lot of these feelings very openly today which seemed to be helpful to patient, and feels she needs to continue to do this further. Trying to "not force myself into it."  States that she does have more to talk about in reference to relationship or lack of relationship with dad and is also finding that she is doing some "letting go".  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 Good insight today.  Based on her work schedule, is scheduling again within a month and knows she can call before that time if needed.  Interventions: Cognitive Behavioral Therapy and Ego-Supportive  Long Term Goal: Develop strategies to reduce symptoms. Short Term Goal: Reduce anxiety and improve coping skills. Objective: Develop strategies for thought distraction when fixating on the future. Objective: Learn two new ways of coping with routine stressors. Objective: Report feeling more positive about self and abilities during therapy sessions.   Diagnosis:   ICD-10-CM   1. Generalized anxiety disorder  F41.1      Plan: Patient in for session today showing good participation and motivation focusing on her anxiety which is improving some, and also some personal and family issues that are very much related to her anxiety.  Is making progress and needs to continue her work with goal-directed behaviors to keep moving in a forward direction. Encouraged patient in her practice of more positive and self affirming thoughts and behaviors as noted in session including: Staying in touch with supportive people, remaining on her prescribed medications, look for more positives versus negatives daily, refrain from assuming negatives or worst-case scenarios, use of encouraging/positive self talk, reduce overthinking and over analyzing which she has already began to do,  letting go of things including guilt from the past if they contribute to holding her back from moving forward, and recognizing the strengths that she shows as she works with goal-directed behaviors to move in a direction that supports increased confidence, improved emotional health, and overall wellbeing.  Goal review and progress/challenges noted with patient.  Next appointment within 2 to 3 weeks.   Mathis Fare,  LCSW

## 2022-12-08 ENCOUNTER — Ambulatory Visit (INDEPENDENT_AMBULATORY_CARE_PROVIDER_SITE_OTHER): Payer: 59 | Admitting: Psychiatry

## 2022-12-08 DIAGNOSIS — F411 Generalized anxiety disorder: Secondary | ICD-10-CM | POA: Diagnosis not present

## 2022-12-08 NOTE — Progress Notes (Signed)
      [facility] Counselor/Therapist Progress Note  Patient ID: Vanessa Washington, MRN: [id number],    Date: 12/08/2022  Time Spent: 55 minutes   Treatment Type: Individual Therapy  Reported Symptoms: anxiety, some sadness  Mental Status Exam:  Appearance:   Casual     Behavior:  Appropriate, Sharing, and Motivated  Motor:  Normal  Speech/Language:   Clear and Coherent  Affect:  Anxious   Mood:  anxious  Thought process:  goal directed  Thought content:    WNL  Sensory/Perceptual disturbances:    WNL  Orientation:  oriented to person, place, time/date, situation, day of week, month of year, year, and stated date of December 08, 2022  Attention:  Good  Concentration:  Good  Memory:  WNL  Fund of knowledge:   Good  Insight:    Good  Judgment:   Good  Impulse Control:  Good   Risk Assessment: Danger to Self:  No Self-injurious Behavior: No Danger to Others: No Duty to Warn:no Physical Aggression / Violence:No  Access to Firearms a concern: No  Gang Involvement:No   Subjective:  Patient in session today reporting anxiety especially trip to [place] family trip. Lots of stressors due to differences amongst family members and patient worried how things may go as relationship with dad is strained and distant. Processed this more in session today which seemed helpful for patient. Also shared and processed her sadness and missing grandfather with whom she was close and "just sad that he is not longer here".  Really misses him at this time of recent graduation and in midst of other family "issues". (Not all details included in this note due to patient privacy needs.)  Patient showing good strength to make the best of some situations within the family and acknowledges this is what is also probably best for her as well.  Interventions: Cognitive Behavioral Therapy and Ego-Supportive  Long Term Goal: Develop strategies to reduce symptoms. Short Term Goal: Reduce anxiety and improve  coping skills. Objective: Develop strategies for thought distraction when fixating on the future. Objective: Learn two new ways of coping with routine stressors. Objective: Report feeling more positive about self and abilities during therapy sessions.   Diagnosis:   ICD-10-CM   1. Generalized anxiety disorder  F41.1      Plan:  Patient in for session today actively participating and motivated as she works further on her anxiety, losses, and other family issues. Is making progress and needs to continue work with goal directed behaviors in order to move in a forward direction. Encouraged patient and practicing more positive and self affirming thoughts and behaviors as noted in session including: Remaining in touch with supportive people, staying on her prescribed medications, look for more positives versus negatives daily, refrain from assuming negatives or worst-case scenarios, use of encouraging/positive self talk, reduce overthinking and over analyzing, letting go of things including guilt from the past as they contribute to holding her back from moving forward, and realize the strengths she shows working with goal-directed behaviors to move in a direction that supports increased confidence, improved emotional health, and her outlook.  Goal review and progress/challenges noted with patient.  Next appointment within 3 to 4 weeks.   Mathis Fare, LCSW

## 2022-12-09 DIAGNOSIS — M1990 Unspecified osteoarthritis, unspecified site: Secondary | ICD-10-CM | POA: Insufficient documentation

## 2022-12-09 DIAGNOSIS — M419 Scoliosis, unspecified: Secondary | ICD-10-CM | POA: Insufficient documentation

## 2022-12-21 ENCOUNTER — Ambulatory Visit: Payer: 59 | Attending: Cardiology | Admitting: Cardiology

## 2022-12-21 ENCOUNTER — Encounter: Payer: Self-pay | Admitting: Cardiology

## 2022-12-21 ENCOUNTER — Other Ambulatory Visit: Payer: Self-pay | Admitting: Adult Health

## 2022-12-21 VITALS — BP 122/68 | HR 79 | Ht 68.0 in | Wt 180.0 lb

## 2022-12-21 DIAGNOSIS — R9431 Abnormal electrocardiogram [ECG] [EKG]: Secondary | ICD-10-CM

## 2022-12-21 DIAGNOSIS — F411 Generalized anxiety disorder: Secondary | ICD-10-CM

## 2022-12-21 DIAGNOSIS — R011 Cardiac murmur, unspecified: Secondary | ICD-10-CM | POA: Diagnosis not present

## 2022-12-21 NOTE — Progress Notes (Signed)
Cardiology Office Note:    Date:  12/21/2022   ID:  Vanessa Washington, DOB [birth year], MRN [id number]  PCP:  Clearnce Hasten, PA-C  Cardiologist:  Garwin Brothers, MD   Referring MD: Sabas Sous, MD    ASSESSMENT:    1. EKG abnormalities   2. Cardiac murmur    PLAN:    In order of problems listed above:  Primary prevention stressed with the patient.  Importance of compliance with diet medication stressed and patient verbalized standing. I discussed with her about aerobic exercise protocol so that she can be physically active for her age.  I discussed low impact exercises such as swimming and riding stationary bicycle and she is agreeable and will try to do so.  I cautioned her against sedentary lifestyle. EKG abnormalities: Unremarkable and I reassured the patient about my findings.  Questions were answered to their satisfaction. Cardiac murmur: Echocardiogram will be done to assess murmur heard on auscultation. Patient will be seen in follow-up appointment in 6 months or earlier if the patient has any concerns.    Medication Adjustments/Labs and Tests Ordered: Current medicines are reviewed at length with the patient today.  Concerns regarding medicines are outlined above.  Orders Placed This Encounter  Procedures   EKG 12-Lead   ECHOCARDIOGRAM COMPLETE   No orders of the defined types were placed in this encounter.    History of Present Illness:    Vanessa Washington is a 22 y.o. female who is being seen today for the evaluation of EKG abnormalities at the request of Bero, Elmer Sow, MD. patient is a pleasant 22 year old female.  She is accompanied by her mother.  She has history of anxiety for which she uses medications.  She went and saw her primary care.  She was found to have EKG abnormalities and referred here.  She denies any history of hypertension dyslipidemia or diabetes mellitus.  No family history of coronary artery disease.  At the time of my  evaluation, the patient is alert awake oriented and in no distress.  She denies any chest pain.  She leads a sedentary lifestyle because of orthopedic issues involving her spine.  Past Medical History:  Diagnosis Date   Acute bilateral thoracic back pain 01/27/2022   Allergic conjunctivitis of left eye 01/02/2017   Chronic bilateral low back pain without sciatica 07/16/2021   DJD (degenerative joint disease)    Scoliosis     Past Surgical History:  Procedure Laterality Date   ADENOIDECTOMY     TYMPANOSTOMY TUBE PLACEMENT      Current Medications: Current Meds  Medication Sig   cetirizine (ZYRTEC) 10 MG chewable tablet Chew 10 mg by mouth daily.   methocarbamol (ROBAXIN) 500 MG tablet Take 1 tablet (500 mg total) by mouth 2 (two) times daily as needed for muscle spasms.   norgestimate-ethinyl estradiol (ORTHO-CYCLEN) 0.25-35 MG-MCG tablet Take 1 tablet by mouth daily.   omeprazole (PRILOSEC) 20 MG capsule Take 1 capsule (20 mg total) by mouth daily.   propranolol (INDERAL) 10 MG tablet TAKE 1 TABLET BY MOUTH THREE TIMES A DAY   sertraline (ZOLOFT) 50 MG tablet Take 1 tablet (50 mg total) by mouth daily.   sucralfate (CARAFATE) 1 g tablet Take 1 tablet (1 g total) by mouth 4 (four) times daily as needed.     Allergies:   Penicillin g and Amoxicillin   Social History   Socioeconomic History   Marital status: Single    Spouse name: Not on  file   Number of children: Not on file   Years of education: Not on file   Highest education level: Not on file  Occupational History   Not on file  Tobacco Use   Smoking status: Never   Smokeless tobacco: Never  Substance and Sexual Activity   Alcohol use: Never   Drug use: Never   Sexual activity: Not on file  Other Topics Concern   Not on file  Social History Narrative   Not on file   Social Determinants of Health   Financial Resource Strain: Not on file  Food Insecurity: Not on file  Transportation Needs: Not on file   Physical Activity: Not on file  Stress: Not on file  Social Connections: Not on file     Family History: The patient's family history includes Cancer in her maternal aunt and maternal grandmother; Diabetes in her maternal grandmother; Heart disease in her maternal grandmother; Hypertension in her maternal grandfather.  ROS:   Please see the history of present illness.    All other systems reviewed and are negative.  EKGs/Labs/Other Studies Reviewed:    The following studies were reviewed today: EKG reveals sinus rhythm with incomplete right bundle branch block and poor anterior forces.   Recent Labs: No results found for requested labs within last 365 days.  Recent Lipid Panel No results found for: "CHOL", "TRIG", "HDL", "CHOLHDL", "VLDL", "LDLCALC", "LDLDIRECT"  Physical Exam:    VS:  BP 122/68   Pulse 79   Ht 5\' 8"  (1.727 m)   Wt 180 lb 0.6 oz (81.7 kg)   SpO2 97%   BMI 27.37 kg/m     Wt Readings from Last 3 Encounters:  12/21/22 180 lb 0.6 oz (81.7 kg)  10/15/22 158 lb (71.7 kg)  06/01/22 160 lb (72.6 kg) (87 %, Z= 1.14)*   * Growth percentiles are based on CDC (Girls, 2-20 Years) data.     GEN: Patient is in no acute distress HEENT: Normal NECK: No JVD; No carotid bruits LYMPHATICS: No lymphadenopathy CARDIAC: S1 S2 regular, 2/6 systolic murmur at the apex. RESPIRATORY:  Clear to auscultation without rales, wheezing or rhonchi  ABDOMEN: Soft, non-tender, non-distended MUSCULOSKELETAL:  No edema; No deformity  SKIN: Warm and dry NEUROLOGIC:  Alert and oriented x 3 PSYCHIATRIC:  Normal affect    Signed, Garwin Brothers, MD  12/21/2022 3:26 PM    [facility]

## 2022-12-21 NOTE — Patient Instructions (Signed)
Medication Instructions:  Your physician recommends that you continue on your current medications as directed. Please refer to the Current Medication list given to you today.  *If you need a refill on your cardiac medications before your next appointment, please call your pharmacy*   Lab Work: None ordered If you have labs (blood work) drawn today and your tests are completely normal, you will receive your results only by: MyChart Message (if you have MyChart) OR A paper copy in the mail If you have any lab test that is abnormal or we need to change your treatment, we will call you to review the results.   Testing/Procedures: Your physician has requested that you have an echocardiogram. Echocardiography is a painless test that uses sound waves to create images of your heart. It provides your doctor with information about the size and shape of your heart and how well your heart's chambers and valves are working. This procedure takes approximately one hour. There are no restrictions for this procedure. Please do NOT wear cologne, perfume, aftershave, or lotions (deodorant is allowed). Please arrive 15 minutes prior to your appointment time.     Follow-Up: At [facility], you and your health needs are our priority.  As part of our continuing mission to provide you with exceptional heart care, we have created designated Provider Care Teams.  These Care Teams include your primary Cardiologist (physician) and Advanced Practice Providers (APPs -  Physician Assistants and Nurse Practitioners) who all work together to provide you with the care you need, when you need it.  We recommend signing up for the patient portal called "MyChart".  Sign up information is provided on this After Visit Summary.  MyChart is used to connect with patients for Virtual Visits (Telemedicine).  Patients are able to view lab/test results, encounter notes, upcoming appointments, etc.  Non-urgent messages can be sent to  your provider as well.   To learn more about what you can do with MyChart, go to [email or web address].    Your next appointment:   12 month(s)  The format for your next appointment:   In Person  Provider:   Rajan Revankar, MD   Other Instructions Echocardiogram An echocardiogram is a test that uses sound waves (ultrasound) to produce images of the heart. Images from an echocardiogram can provide important information about: Heart size and shape. The size and thickness and movement of your heart's walls. Heart muscle function and strength. Heart valve function or if you have stenosis. Stenosis is when the heart valves are too narrow. If blood is flowing backward through the heart valves (regurgitation). A tumor or infectious growth around the heart valves. Areas of heart muscle that are not working well because of poor blood flow or injury from a heart attack. Aneurysm detection. An aneurysm is a weak or damaged part of an artery wall. The wall bulges out from the normal force of blood pumping through the body. Tell a health care provider about: Any allergies you have. All medicines you are taking, including vitamins, herbs, eye drops, creams, and over-the-counter medicines. Any blood disorders you have. Any surgeries you have had. Any medical conditions you have. Whether you are pregnant or may be pregnant. What are the risks? Generally, this is a safe test. However, problems may occur, including an allergic reaction to dye (contrast) that may be used during the test. What happens before the test? No specific preparation is needed. You may eat and drink normally. What happens during the test? You will   take off your clothes from the waist up and put on a hospital gown. Electrodes or electrocardiogram (ECG)patches may be placed on your chest. The electrodes or patches are then connected to a device that monitors your heart rate and rhythm. You will lie down on a table for an  ultrasound exam. A gel will be applied to your chest to help sound waves pass through your skin. A handheld device, called a transducer, will be pressed against your chest and moved over your heart. The transducer produces sound waves that travel to your heart and bounce back (or "echo" back) to the transducer. These sound waves will be captured in real-time and changed into images of your heart that can be viewed on a video monitor. The images will be recorded on a computer and reviewed by your health care provider. You may be asked to change positions or hold your breath for a short time. This makes it easier to get different views or better views of your heart. In some cases, you may receive contrast through an IV in one of your veins. This can improve the quality of the pictures from your heart. The procedure may vary among health care providers and hospitals.   What can I expect after the test? You may return to your normal, everyday life, including diet, activities, and medicines, unless your health care provider tells you not to do that. Follow these instructions at home: It is up to you to get the results of your test. Ask your health care provider, or the department that is doing the test, when your results will be ready. Keep all follow-up visits. This is important. Summary An echocardiogram is a test that uses sound waves (ultrasound) to produce images of the heart. Images from an echocardiogram can provide important information about the size and shape of your heart, heart muscle function, heart valve function, and other possible heart problems. You do not need to do anything to prepare before this test. You may eat and drink normally. After the echocardiogram is completed, you may return to your normal, everyday life, unless your health care provider tells you not to do that. This information is not intended to replace advice given to you by your health care provider. Make sure you  discuss any questions you have with your health care provider. Document Revised: 02/05/2020 Document Reviewed: 02/05/2020 Elsevier Patient Education  2021 Elsevier Inc.   Important Information About Sugar        

## 2022-12-27 ENCOUNTER — Ambulatory Visit (INDEPENDENT_AMBULATORY_CARE_PROVIDER_SITE_OTHER): Payer: 59 | Admitting: Adult Health

## 2022-12-27 DIAGNOSIS — Z0389 Encounter for observation for other suspected diseases and conditions ruled out: Secondary | ICD-10-CM

## 2022-12-27 NOTE — Progress Notes (Signed)
Patient no show appointment. ? ?

## 2022-12-29 ENCOUNTER — Ambulatory Visit (INDEPENDENT_AMBULATORY_CARE_PROVIDER_SITE_OTHER): Payer: 59 | Admitting: Psychiatry

## 2022-12-29 DIAGNOSIS — F411 Generalized anxiety disorder: Secondary | ICD-10-CM

## 2022-12-29 NOTE — Progress Notes (Signed)
[facility] Counselor/Therapist Progress Note  Patient ID: Vanessa Washington, MRN: [id number],    Date: 12/29/2022  Time Spent: 55 minutes   Treatment Type: Individual Therapy  Reported Symptoms: improved anxiety, no other symptoms most recently  Mental Status Exam:  Appearance:   Casual     Behavior:  Appropriate, Sharing, and Motivated  Motor:  Normal  Speech/Language:   Clear and Coherent  Affect:  anxious  Mood:  anxious  Thought process:  goal directed  Thought content:    WNL  Sensory/Perceptual disturbances:    WNL  Orientation:  oriented to person, place, time/date, situation, day of week, month of year, year, and stated date of December 29, 2022  Attention:  Good  Concentration:  Good  Memory:  WNL  Fund of knowledge:   Good  Insight:    Good  Judgment:   Good  Impulse Control:  Good   Risk Assessment: Danger to Self:  No Self-injurious Behavior: No Danger to Others: No Duty to Warn:no Physical Aggression / Violence:No  Access to Firearms a concern: No  Gang Involvement:No   Subjective:  Patient in for session today and reporting anxiety "but am improving." No depression. Shared that visit with Dad went ok "in some ways" but didn't talk with Dad during her visit much re: her concerns with him. Talked with dad's GF and felt some understanding and similarities. Almost spoke with dad 1:1 but chose not to. Had bad encounter just prior to leaving and coming back home, with GF involved. Processed this whole experience which led patient to talk in much more detail about her hurt and concerns with dad, and to some extent paternal grandmother. Showed some strength with dad and grandma that she had not shown previously. "Left out at times" and dealing  with those hurtful feelings as well. Seemed to be helpful today to patient today and her processing a lot of her trip to visit her father, but also in looking forward at this point and how she is going to proceed, have healthy  boundaries, and work on some resolve issues to move in a positive direction.  Interventions: Cognitive Behavioral Therapy and Ego-Supportive  Long Term Goal: Develop strategies to reduce symptoms. Short Term Goal: Reduce anxiety and improve coping skills. Objective: Develop strategies for thought distraction when fixating on the future. Objective: Learn two new ways of coping with routine stressors. Objective: Report feeling more positive about self and abilities during therapy sessions.   Diagnosis:   ICD-10-CM   1. Generalized anxiety disorder  F41.1      Plan:  Patient in for session today showing good motivation and participation working more on her anxiety, multiple family issues/hurts, and losses, but also included some of her gains and new insights that are helpful to her.  She is making progress and needs to continue working with goal-directed behaviors in order to move in a forward direction. Encouraged patient in her practice of more positive and self-affirming thoughts and behaviors as noted in session including: Staying in touch with supportive people, remaining on her prescribed medications, look for more positives versus negatives daily, refrain from assuming negatives and worst-case scenarios, use of encouraging/positive self talk, reduce overthinking and over analyzing, letting go of things including guilt from the past as a contribute to holding her back from moving forward now, and recognize the strength she shows working with goal-directed behaviors to move in a direction that supports increased confidence, improved emotional health, and overall wellbeing.  Goal review and progress/challenges noted with patient.  Next appointment within 3 to 4 weeks.   Shanon Ace, LCSW

## 2023-01-02 IMAGING — MR MR LUMBAR SPINE W/O CM
4 of 5 series · 21 of 48 positions shown · non-contrast
Comparison: None.

CLINICAL DATA: Low back pain with symptoms persisting over 6 weeks.

EXAM:
MRI LUMBAR SPINE WITHOUT CONTRAST
TECHNIQUE: Multiplanar, multisequence MR imaging of the lumbar spine was
performed. No intravenous contrast was administered.

[Series 6: T1 · sagittal · 4.0mm · 0.73mm/px · 3 of 15 slices shown (1 of 2)]
[im 3/15]
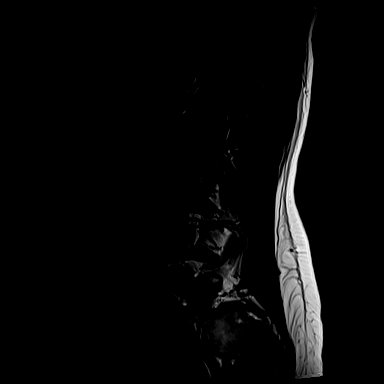
[im 9/15]
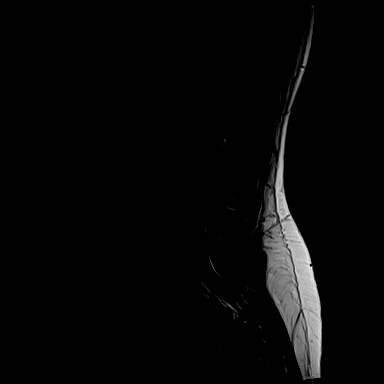
[im 15/15]
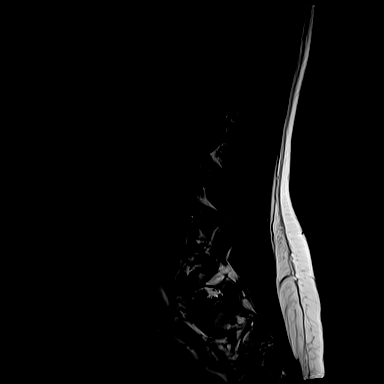

[Series 7: T2 · sagittal · 4.0mm · 0.73mm/px · 6 of 15 slices shown (1 of 2)]
[im 1/15]
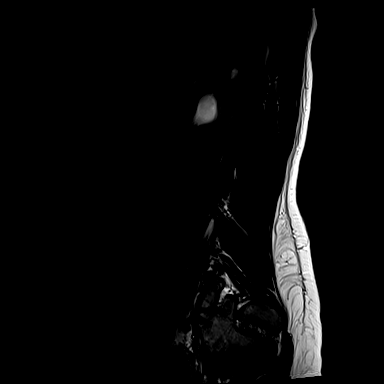
[im 3/15]
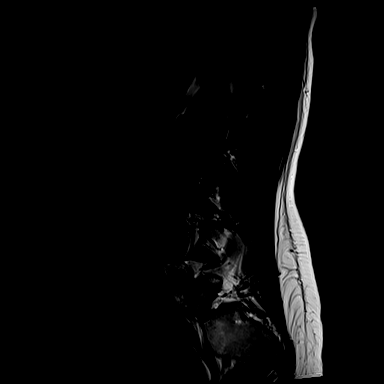
[im 6/15]
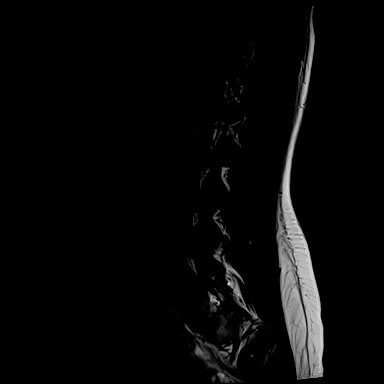
[im 9/15]
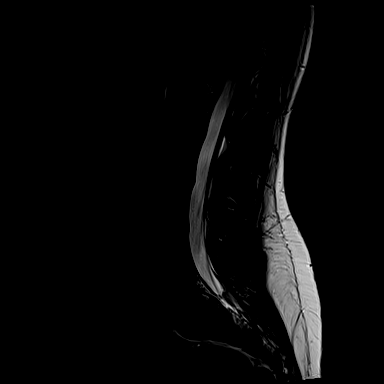
[im 12/15]
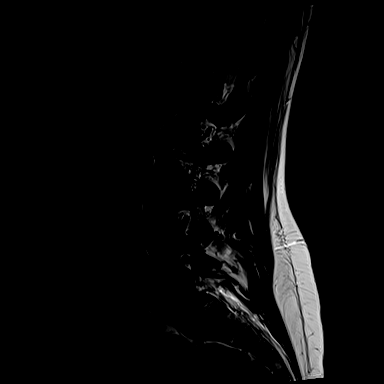
[im 15/15]
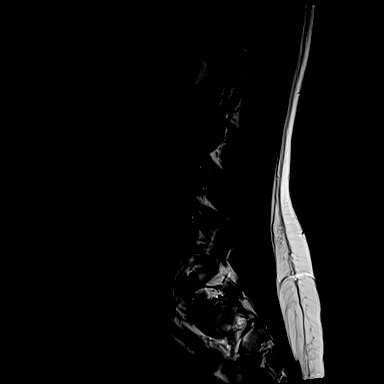

[Series 8: T1 · axial · 4.0mm · 0.56mm/px · z∈[-65,+103]mm · 3 of 39 slices shown (2 of 2)]
[im 6/39]
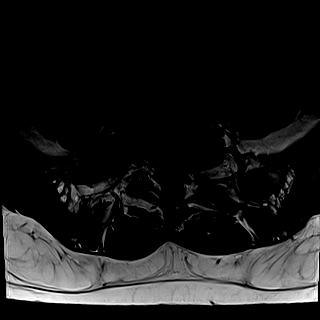
[im 20/39]
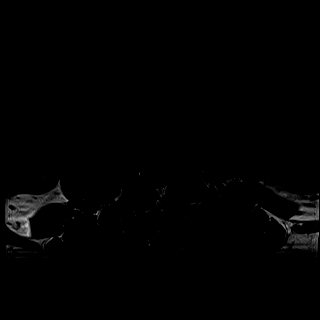
[im 33/39]
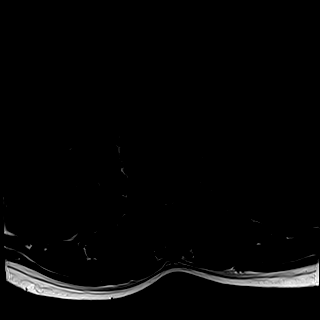

[Series 9: T2 · axial · 4.0mm · 0.28mm/px · z∈[-90,+133]mm · 9 of 39 slices shown (2 of 2)]
[im 1/39]
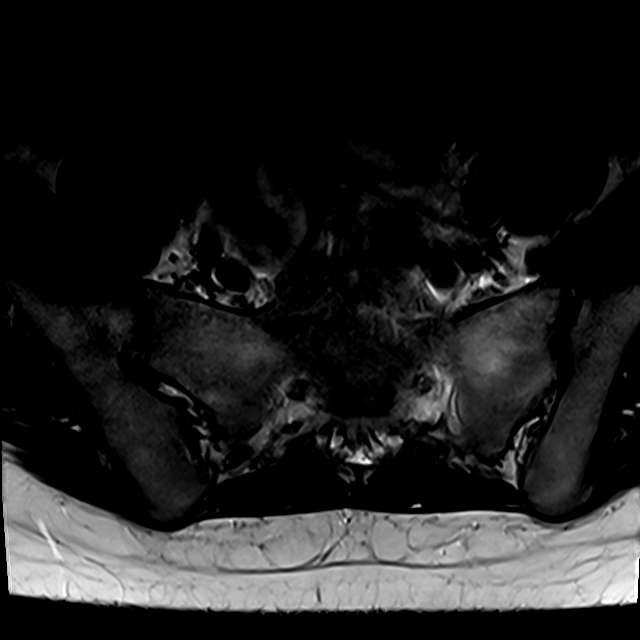
[im 6/39]
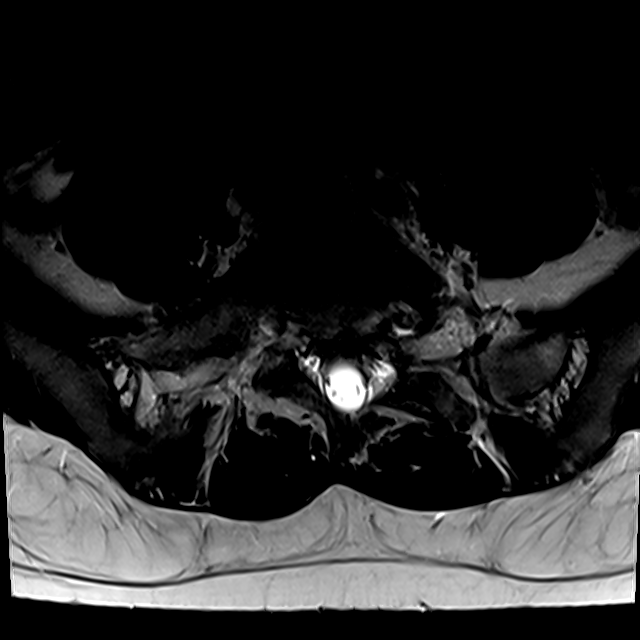
[im 11/39]
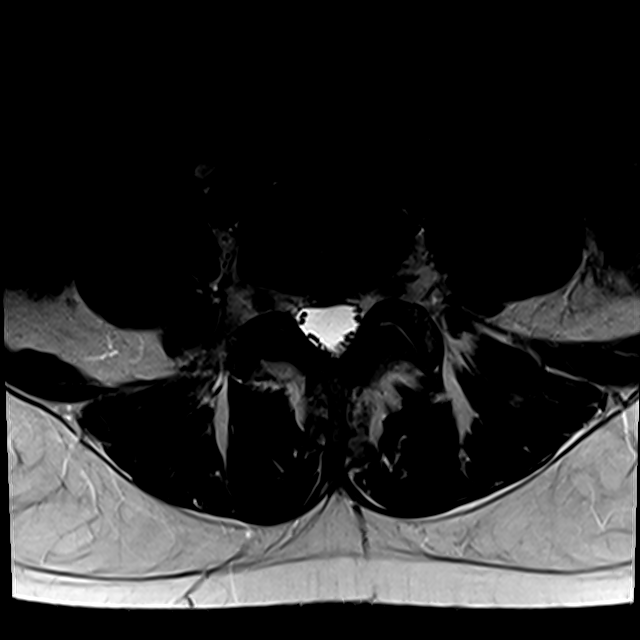
[im 17/39]
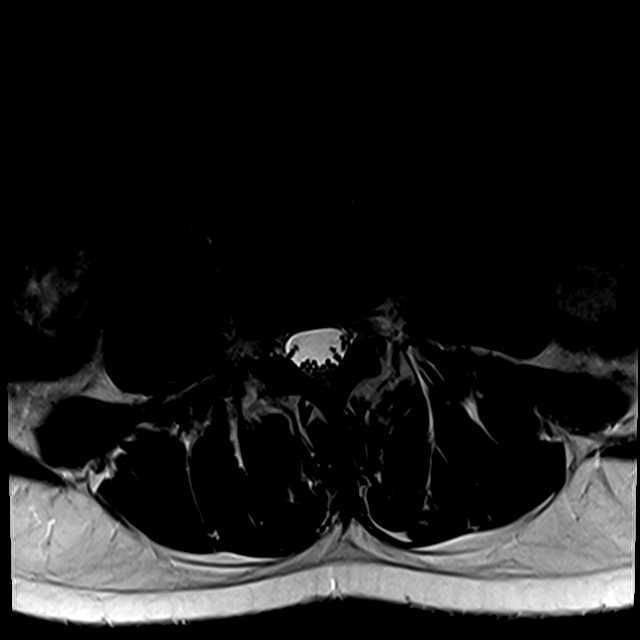
[im 20/39]
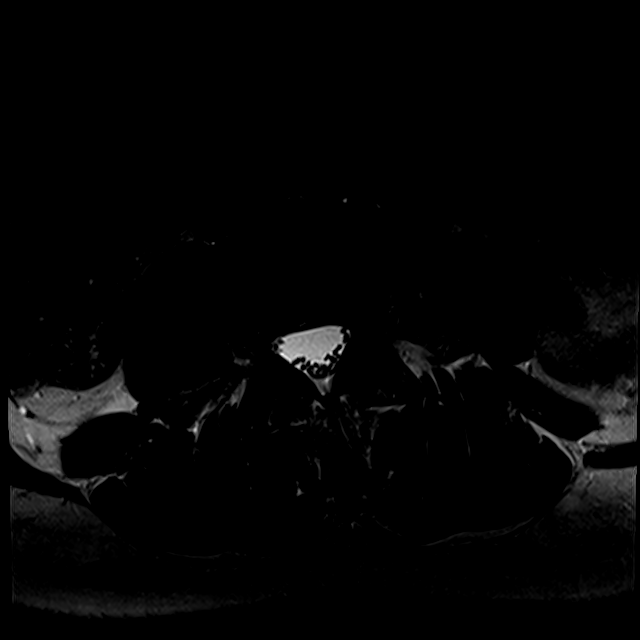
[im 22/39]
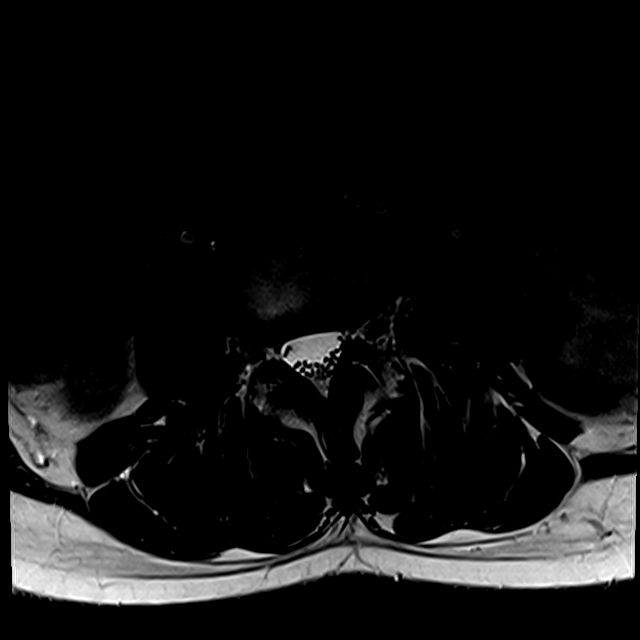
[im 28/39]
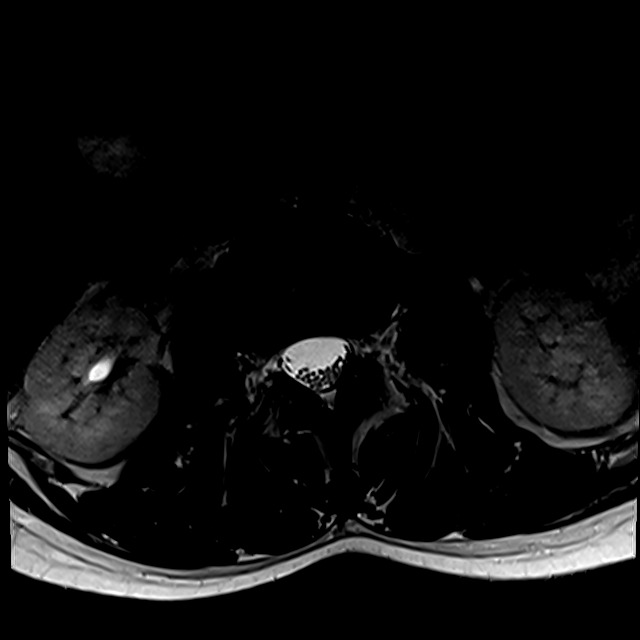
[im 33/39]
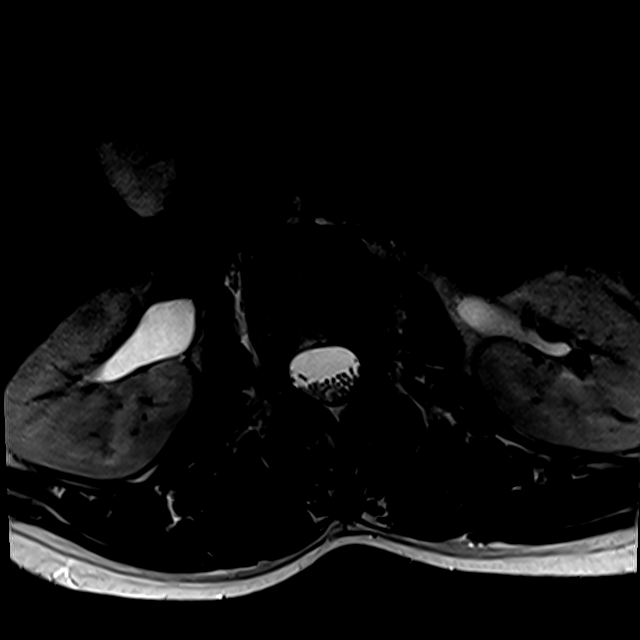
[im 39/39]
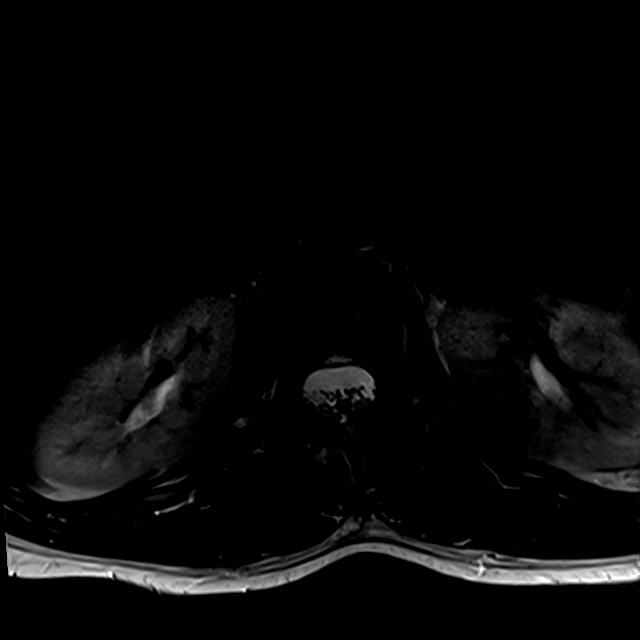

[21 of 48 positions shown; findings below may reference images not displayed]

FINDINGS: Segmentation: Transitional S1 vertebra based on the lowest ribs by
07/16/2021 radiography.

Alignment:  Exaggerated lumbar lordosis.  Mild dextroscoliosis

Vertebrae:  No fracture, evidence of discitis, or bone lesion.

Conus medullaris and cauda equina: Conus extends to the T12 level.
Conus and cauda equina appear normal.

Paraspinal and other soft tissues: Of negative for perispinal mass
or inflammation

Disc levels:

L5-S1:Mild disc desiccation and narrowing with small central
protrusion at an annular fissure. Borderline facet spurring. No
neural impingement.

S1-2: Rudimentary disc space. Accentuated fatty marrow at the
articular processes without visible fracture defect or marrow edema.
Narrow disc space and scoliosis with foraminal bulging causes
moderate narrowing of the left foramen based on sagittal images.
IMPRESSION: 1. Transitional S1 vertebra based on the lowest ribs by prior
radiography.
2. Mild scoliosis.
3. L5-S1 disc degeneration with small noncompressive central
protrusion.
4. S1-2 moderate left foraminal narrowing.

## 2023-01-20 ENCOUNTER — Ambulatory Visit: Payer: 59 | Attending: Cardiology

## 2023-01-20 ENCOUNTER — Other Ambulatory Visit: Payer: Self-pay | Admitting: Adult Health

## 2023-01-20 DIAGNOSIS — R9431 Abnormal electrocardiogram [ECG] [EKG]: Secondary | ICD-10-CM

## 2023-01-20 DIAGNOSIS — F411 Generalized anxiety disorder: Secondary | ICD-10-CM

## 2023-01-20 LAB — ECHOCARDIOGRAM COMPLETE
Area-P 1/2: 3.39 cm2
S' Lateral: 3.1 cm

## 2023-02-02 ENCOUNTER — Ambulatory Visit (INDEPENDENT_AMBULATORY_CARE_PROVIDER_SITE_OTHER): Payer: 59 | Admitting: Psychiatry

## 2023-02-02 ENCOUNTER — Ambulatory Visit: Payer: 59 | Admitting: Psychiatry

## 2023-02-02 DIAGNOSIS — F411 Generalized anxiety disorder: Secondary | ICD-10-CM | POA: Diagnosis not present

## 2023-02-02 NOTE — Progress Notes (Signed)
[facility] Counselor/Therapist Progress Note  Patient ID: Vanessa Washington, MRN: [id number],    Date: 02/02/2023  Time Spent: 53 minutes   Treatment Type: Individual Therapy  Reported Symptoms: "a little anxious about"  Mental Status Exam:  Appearance:   Casual and Neat     Behavior:  Appropriate, Sharing, and Motivated  Motor:  Normal  Speech/Language:   Clear and Coherent  Affect:  anxious  Mood:  anxious  Thought process:  goal directed  Thought content:    WNL  Sensory/Perceptual disturbances:    WNL  Orientation:  oriented to person, place, time/date, situation, day of week, month of year, year, and stated date of Aug. 7, 2024  Attention:  Good  Concentration:  Good  Memory:  WNL  Fund of knowledge:   Good  Insight:    Good  Judgment:   Good  Impulse Control:  Good   Risk Assessment: Danger to Self:  No Self-injurious Behavior: No Danger to Others: No Duty to Warn:no Physical Aggression / Violence:No  Access to Firearms a concern: No  Gang Involvement:No   Subjective:   Patient in session today reporting some anxiety and "still improving". Confidentially sharing workplace concerns. Wanting to pursue other options re: job. Had interview [facility]) today that went well and hoping to hear from them. May pursue ([employer]) employment later. No contacts from father nor his girlfriend. In some ways reports more happiness and contentment outside of her work and the lack of relationship with dad, and processed this in more detail today in session. No depression. Continues working on her thoughts/feelings re: lack of relationship with dad. (Not all details included in this note due to patient privacy needs.) Is showing more confidence and less hurt.  Continuing to focus on having healthier boundaries and proceeding in some directions that better match up with her personal goals.  Interventions: Cognitive Behavioral Therapy and Ego-Supportive  Long Term Goal: Develop  strategies to reduce symptoms. Short Term Goal: Reduce anxiety and improve coping skills. Objective: Develop strategies for thought distraction when fixating on the future. Objective: Learn two new ways of coping with routine stressors. Objective: Report feeling more positive about self and abilities during therapy sessions.   Diagnosis:   ICD-10-CM   1. Generalized anxiety disorder  F41.1      Plan:  Patient in session today motivated and participating well as she worked further on her anxiety and some relationship issues.  Is definitely making progress and needs to continue working with her goal-directed behaviors in order to keep moving in a forward direction.  Continues in gaining more insight into family issues/hurts and losses and gaining more skills and strength to better confront these issues and not let them block her from moving forward. Reminded and encouraged patient to be practicing more positive and self affirming thoughts and behaviors as noted in session including: Remain in touch with supportive people, stay on her prescribed medications, look for more positives versus negatives daily, refrain from assuming negatives are worst-case scenarios, use of encouraging/positive self talk, reduce overthinking and over analyzing, letting go of things including guilt from the past as it contributes to holding her back now for moving forward, and realized the strength she shows working with goal-directed behaviors to move in the direction that supports increased confidence, improved emotional health, and outlook.  Goal review and progress/challenges noted with patient.  Next appointment within 4 weeks.   Mathis Fare, LCSW

## 2023-03-01 ENCOUNTER — Other Ambulatory Visit: Payer: Self-pay | Admitting: Family Medicine

## 2023-03-01 ENCOUNTER — Other Ambulatory Visit: Payer: Self-pay

## 2023-03-01 MED ORDER — MELOXICAM 15 MG PO TABS: 15.0000 mg | ORAL_TABLET | Freq: Every day | ORAL | 1 refills | Status: DC

## 2023-03-02 ENCOUNTER — Other Ambulatory Visit: Payer: Self-pay

## 2023-03-02 MED ORDER — METHOCARBAMOL 500 MG PO TABS: 500.0000 mg | ORAL_TABLET | Freq: Two times a day (BID) | ORAL | 0 refills | Status: AC | PRN

## 2023-03-18 ENCOUNTER — Other Ambulatory Visit: Payer: Self-pay | Admitting: Adult Health

## 2023-03-18 DIAGNOSIS — F411 Generalized anxiety disorder: Secondary | ICD-10-CM

## 2023-03-30 ENCOUNTER — Other Ambulatory Visit: Payer: Self-pay | Admitting: Adult Health

## 2023-03-30 DIAGNOSIS — F411 Generalized anxiety disorder: Secondary | ICD-10-CM

## 2023-03-31 ENCOUNTER — Encounter: Payer: Self-pay | Admitting: Adult Health

## 2023-03-31 ENCOUNTER — Ambulatory Visit: Payer: 59 | Admitting: Adult Health

## 2023-03-31 DIAGNOSIS — F411 Generalized anxiety disorder: Secondary | ICD-10-CM | POA: Diagnosis not present

## 2023-03-31 MED ORDER — SERTRALINE HCL 50 MG PO TABS: 50.0000 mg | ORAL_TABLET | Freq: Every day | ORAL | 3 refills | Status: DC

## 2023-03-31 NOTE — Progress Notes (Signed)
Vanessa Washington [id number] Apr 05, 2001 22 y.o.  Subjective:   Patient ID:  Vanessa Washington is a 22 y.o. (DOB 03/17/2001) female.  Chief Complaint: No chief complaint on file.   HPI [name] presents to the office today for follow-up of GAD.  Describes mood today as "ok". Pleasant. Denies tearfulness. Mood symptoms - denies depression, and irritability. Denies panic attacks. Denies worry, rumination, and over thinking. Reports decreased OCD behaviors. Denies irrational and intrusive thoughts. Mood is consistent. Stating "I feel like I'm doing good". Feels like the Zoloft continues to work well. Stable interest and motivation. Taking medications as prescribed.  Energy levels stable. Active, has a regular exercise routine.  Enjoys some usual interests and activities. Single. Lives with her mom, grandmother and 2 brothers - 2 dogs and an outdoor cat. Spending time with family. Appetite adequate. Weight gain 164 to 180 pounds - 68". Sleeps well most nights. Averages 8 hours. Focus and concentration stable. Completing tasks. Managing aspects of household. Working at [employer] Denies SI or HI.  Denies AH or VH. Denies self harm. Denies substance use.  MDI    Flowsheet Row Counselor from 07/29/2022 in [facility]  Total Score (max 50) 19      Flowsheet Row ED from 10/15/2022 in [facility]  C-SSRS RISK CATEGORY No Risk        Review of Systems:  Review of Systems  Musculoskeletal:  Negative for gait problem.  Neurological:  Negative for tremors.  Psychiatric/Behavioral:         Please refer to HPI    Medications: I have reviewed the patient's current medications.  Current Outpatient Medications  Medication Sig Dispense Refill   cetirizine (ZYRTEC) 10 MG chewable tablet Chew 10 mg by mouth daily.     methocarbamol (ROBAXIN) 500 MG tablet Take 1 tablet (500 mg total) by mouth 2 (two) times  daily as needed for muscle spasms. 60 tablet 0   norgestimate-ethinyl estradiol (ORTHO-CYCLEN) 0.25-35 MG-MCG tablet Take 1 tablet by mouth daily.     omeprazole (PRILOSEC) 20 MG capsule Take 1 capsule (20 mg total) by mouth daily. 30 capsule 1   propranolol (INDERAL) 10 MG tablet TAKE 1 TABLET BY MOUTH THREE TIMES A DAY 90 tablet 0   sertraline (ZOLOFT) 50 MG tablet TAKE 1 TABLET BY MOUTH EVERY DAY 90 tablet 0   sucralfate (CARAFATE) 1 g tablet Take 1 tablet (1 g total) by mouth 4 (four) times daily as needed. 30 tablet 0   No current facility-administered medications for this visit.    Medication Side Effects: None  Allergies:  Allergies  Allergen Reactions   Penicillin G Other (See Comments)    Childhood reaction  Unknown   Amoxicillin Rash    Past Medical History:  Diagnosis Date   Acute bilateral thoracic back pain 01/27/2022   Allergic conjunctivitis of left eye 01/02/2017   Chronic bilateral low back pain without sciatica 07/16/2021   DJD (degenerative joint disease)    Scoliosis     Past Medical History, Surgical history, Social history, and Family history were reviewed and updated as appropriate.   Please see review of systems for further details on the patient's review from today.   Objective:   Physical Exam:  There were no vitals taken for this visit.  Physical Exam Constitutional:      General: She is not in acute distress. Musculoskeletal:        General: No deformity.  Neurological:  Mental Status: She is alert and oriented to person, place, and time.     Coordination: Coordination normal.  Psychiatric:        Attention and Perception: Attention and perception normal. She does not perceive auditory or visual hallucinations.        Mood and Affect: Mood normal. Mood is not anxious or depressed. Affect is not labile, blunt, angry or inappropriate.        Speech: Speech normal.        Behavior: Behavior normal.        Thought Content: Thought  content normal. Thought content is not paranoid or delusional. Thought content does not include homicidal or suicidal ideation. Thought content does not include homicidal or suicidal plan.        Cognition and Memory: Cognition and memory normal.        Judgment: Judgment normal.     Comments: Insight intact     Lab Review:  No results found for: "NA", "K", "CL", "CO2", "GLUCOSE", "BUN", "CREATININE", "CALCIUM", "PROT", "ALBUMIN", "AST", "ALT", "ALKPHOS", "BILITOT", "GFRNONAA", "GFRAA"  No results found for: "WBC", "RBC", "HGB", "HCT", "PLT", "MCV", "MCH", "MCHC", "RDW", "LYMPHSABS", "MONOABS", "EOSABS", "BASOSABS"  No results found for: "POCLITH", "LITHIUM"   No results found for: "PHENYTOIN", "PHENOBARB", "VALPROATE", "CBMZ"   .res Assessment: Plan:    Plan:  PDMP reviewed  Propranolol 10mg  TID for anxiety  Zoloft 50mg  daily  Consider Hydroxyzine  RTC 3 months  Time spent with patient was 15 minutes. Greater than 50% of face to face time with patient was spent on counseling and coordination of care.    Patient advised to contact office with any questions, adverse effects, or acute worsening in signs and symptoms.  Diagnoses and all orders for this visit:  Generalized anxiety disorder     Please see After Visit Summary for patient specific instructions.  No future appointments.   No orders of the defined types were placed in this encounter.   -------------------------------

## 2023-06-09 ENCOUNTER — Emergency Department (HOSPITAL_COMMUNITY): Payer: 59

## 2023-06-09 ENCOUNTER — Other Ambulatory Visit: Payer: Self-pay

## 2023-06-09 ENCOUNTER — Emergency Department (HOSPITAL_COMMUNITY)
Admission: EM | Admit: 2023-06-09 | Discharge: 2023-06-09 | Disposition: A | Discharge status: Home / Self Care | Payer: 59 | Attending: Emergency Medicine | Admitting: Emergency Medicine

## 2023-06-09 DIAGNOSIS — Y92410 Unspecified street and highway as the place of occurrence of the external cause: Secondary | ICD-10-CM | POA: Diagnosis not present

## 2023-06-09 DIAGNOSIS — M25561 Pain in right knee: Secondary | ICD-10-CM | POA: Insufficient documentation

## 2023-06-09 DIAGNOSIS — R102 Pelvic and perineal pain: Secondary | ICD-10-CM | POA: Insufficient documentation

## 2023-06-09 DIAGNOSIS — J0110 Acute frontal sinusitis, unspecified: Secondary | ICD-10-CM | POA: Insufficient documentation

## 2023-06-09 DIAGNOSIS — S27321A Contusion of lung, unilateral, initial encounter: Secondary | ICD-10-CM | POA: Diagnosis not present

## 2023-06-09 DIAGNOSIS — R519 Headache, unspecified: Secondary | ICD-10-CM | POA: Diagnosis not present

## 2023-06-09 DIAGNOSIS — S299XXA Unspecified injury of thorax, initial encounter: Secondary | ICD-10-CM | POA: Diagnosis present

## 2023-06-09 LAB — CBC
HCT: 42.1 % (ref 36.0–46.0)
Hemoglobin: 14.1 g/dL (ref 12.0–15.0)
MCH: 30.1 pg (ref 26.0–34.0)
MCHC: 33.5 g/dL (ref 30.0–36.0)
MCV: 89.8 fL (ref 80.0–100.0)
Platelets: 246 10*3/uL (ref 150–400)
RBC: 4.69 MIL/uL (ref 3.87–5.11)
RDW: 12.5 % (ref 11.5–15.5)
WBC: 5.8 10*3/uL (ref 4.0–10.5)
nRBC: 0 % (ref 0.0–0.2)

## 2023-06-09 LAB — COMPREHENSIVE METABOLIC PANEL
ALT: 18 U/L (ref 0–44)
AST: 32 U/L (ref 15–41)
Albumin: 4.3 g/dL (ref 3.5–5.0)
Alkaline Phosphatase: 41 U/L (ref 38–126)
Anion gap: 14 (ref 5–15)
BUN: 11 mg/dL (ref 6–20)
CO2: 20 mmol/L — ABNORMAL LOW (ref 22–32)
Calcium: 9.8 mg/dL (ref 8.9–10.3)
Chloride: 103 mmol/L (ref 98–111)
Creatinine, Ser: 0.87 mg/dL (ref 0.44–1.00)
GFR, Estimated: >60 mL/min (ref >60)
Glucose, Bld: 113 mg/dL — ABNORMAL HIGH (ref 70–99)
Potassium: 3.3 mmol/L — ABNORMAL LOW (ref 3.5–5.1)
Sodium: 137 mmol/L (ref 135–145)
Total Bilirubin: 0.7 mg/dL (ref <1.2)
Total Protein: 7.3 g/dL (ref 6.5–8.1)

## 2023-06-09 LAB — I-STAT CHEM 8, ED
BUN: 12 mg/dL (ref 6–20)
Calcium, Ion: 1.05 mmol/L — ABNORMAL LOW (ref 1.15–1.40)
Chloride: 105 mmol/L (ref 98–111)
Creatinine, Ser: 0.8 mg/dL (ref 0.44–1.00)
Glucose, Bld: 116 mg/dL — ABNORMAL HIGH (ref 70–99)
HCT: 42 % (ref 36.0–46.0)
Hemoglobin: 14.3 g/dL (ref 12.0–15.0)
Potassium: 3.2 mmol/L — ABNORMAL LOW (ref 3.5–5.1)
Sodium: 138 mmol/L (ref 135–145)
TCO2: 20 mmol/L — ABNORMAL LOW (ref 22–32)

## 2023-06-09 LAB — I-STAT CG4 LACTIC ACID, ED: Lactic Acid, Venous: 4 mmol/L (ref 0.5–1.9)

## 2023-06-09 LAB — SAMPLE TO BLOOD BANK

## 2023-06-09 LAB — ETHANOL: Alcohol, Ethyl (B): <10 mg/dL (ref <10)

## 2023-06-09 LAB — PROTIME-INR
INR: 1 (ref 0.8–1.2)
Prothrombin Time: 13.1 s (ref 11.4–15.2)

## 2023-06-09 MED ORDER — CLINDAMYCIN HCL 150 MG PO CAPS: 300.0000 mg | ORAL_CAPSULE | Freq: Three times a day (TID) | ORAL | 0 refills | Status: AC

## 2023-06-09 MED ORDER — CLINDAMYCIN PHOSPHATE 600 MG/50ML IV SOLN
600.0000 mg | Freq: Once | INTRAVENOUS | Status: AC
  Administered 2023-06-09: 600 mg via INTRAVENOUS
  Filled 2023-06-09: qty 50

## 2023-06-09 MED ORDER — SODIUM CHLORIDE 0.9 % IV BOLUS
1000.0000 mL | Freq: Once | INTRAVENOUS | Status: AC
  Administered 2023-06-09: 1000 mL via INTRAVENOUS

## 2023-06-09 MED ORDER — IOHEXOL 350 MG/ML SOLN
75.0000 mL | Freq: Once | INTRAVENOUS | Status: AC | PRN
  Administered 2023-06-09: 75 mL via INTRAVENOUS

## 2023-06-09 MED ORDER — TETANUS-DIPHTH-ACELL PERTUSSIS 5-2.5-18.5 LF-MCG/0.5 IM SUSY
0.5000 mL | PREFILLED_SYRINGE | Freq: Once | INTRAMUSCULAR | Status: DC
  Filled 2023-06-09: qty 0.5
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
## 2023-06-09 NOTE — ED Notes (Signed)
Patient transported to CT by this RN 
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
## 2023-06-09 NOTE — ED Notes (Signed)
Port XR at bedside.
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
## 2023-06-09 NOTE — ED Notes (Signed)
RN called lab to find out status of lab work
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
## 2023-06-09 NOTE — ED Notes (Signed)
C-spine cleared by Jeraldine Loots MD

## 2023-06-09 NOTE — ED Notes (Signed)
Updated EDP Lockwood MD about patient elevated temperature of 100.59F.
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
## 2023-06-14 ENCOUNTER — Encounter (HOSPITAL_BASED_OUTPATIENT_CLINIC_OR_DEPARTMENT_OTHER): Payer: Self-pay | Admitting: Urology

## 2023-06-14 ENCOUNTER — Emergency Department (HOSPITAL_BASED_OUTPATIENT_CLINIC_OR_DEPARTMENT_OTHER)
Admission: EM | Admit: 2023-06-14 | Discharge: 2023-06-14 | Discharge status: Against Medical Advice | Payer: BC Managed Care – PPO | Attending: Emergency Medicine | Admitting: Emergency Medicine

## 2023-06-14 DIAGNOSIS — R0789 Other chest pain: Secondary | ICD-10-CM | POA: Insufficient documentation

## 2023-06-14 DIAGNOSIS — Y92410 Unspecified street and highway as the place of occurrence of the external cause: Secondary | ICD-10-CM | POA: Diagnosis not present

## 2023-06-14 DIAGNOSIS — S27329A Contusion of lung, unspecified, initial encounter: Secondary | ICD-10-CM | POA: Insufficient documentation

## 2023-06-14 DIAGNOSIS — Z5321 Procedure and treatment not carried out due to patient leaving prior to being seen by health care provider: Secondary | ICD-10-CM | POA: Insufficient documentation
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
## 2024-03-20 ENCOUNTER — Other Ambulatory Visit: Payer: Self-pay | Admitting: Adult Health

## 2024-03-20 DIAGNOSIS — F411 Generalized anxiety disorder: Secondary | ICD-10-CM

## 2024-03-30 ENCOUNTER — Telehealth: Payer: 59 | Admitting: Adult Health

## 2024-03-30 ENCOUNTER — Encounter: Payer: Self-pay | Admitting: Adult Health

## 2024-03-30 DIAGNOSIS — F411 Generalized anxiety disorder: Secondary | ICD-10-CM

## 2024-03-30 MED ORDER — SERTRALINE HCL 50 MG PO TABS: 50.0000 mg | ORAL_TABLET | Freq: Every day | ORAL | 1 refills | Status: DC

## 2024-03-30 NOTE — Progress Notes (Signed)
 Vanessa Washington [id number] [birth year] 23 y.o.  Virtual Visit via Video Note  I connected with pt @ on 03/30/24 at  3:30 PM EDT by a video enabled telemedicine application and verified that I am speaking with the correct person using two identifiers.   I discussed the limitations of evaluation and management by telemedicine and the availability of in person appointments. The patient expressed understanding and agreed to proceed.  I discussed the assessment and treatment plan with the patient. The patient was provided an opportunity to ask questions and all were answered. The patient agreed with the plan and demonstrated an understanding of the instructions.   The patient was advised to call back or seek an in-person evaluation if the symptoms worsen or if the condition fails to improve as anticipated.  I provided 10 minutes of non-face-to-face time during this encounter.  The patient was located at home.  The provider was located at [facility].   Vanessa LOISE Sayers, NP   Subjective:   Patient ID:  Vanessa Washington is a 23 y.o. (DOB 08/28/2000) female.  Chief Complaint: No chief complaint on file.   HPI Vanessa Washington presents for follow-up of GAD.  Describes mood today as ok. Pleasant. Denies tearfulness. Mood symptoms - denies anxiety, depression and irritability. Reports stable interest and motivation. Denies panic attacks. Denies worry and over thinking. Reports some rumination, Reports decreased OCD behaviors. Denies irrational and intrusive thoughts. Reports mood is stable. Stating I feel like I'm doing ok. Feels like the Zoloft  continues to work well. Taking medications as prescribed.  Energy levels stable. Active, has a regular exercise routine.  Enjoys some usual interests and activities. Single. Lives with her mom, grandmother and 2 brothers - 2 dogs and an outdoor cat. Spending time with family. Appetite adequate. Weight gain 180 to 200 pounds -  68. Sleeps well most nights. Averages 8 hours. Focus and concentration stable. Completing tasks. Managing aspects of household. Working at [employer] Denies SI or HI.  Denies AH or VH. Denies self harm. Denies substance use.   Review of Systems:  Review of Systems  Musculoskeletal:  Negative for gait problem.  Neurological:  Negative for tremors.  Psychiatric/Behavioral:         Please refer to HPI    Medications: I have reviewed the patient's current medications.  Current Outpatient Medications  Medication Sig Dispense Refill   cetirizine (ZYRTEC) 10 MG tablet Take 10 mg by mouth daily.     methocarbamol  (ROBAXIN ) 500 MG tablet Take 1 tablet (500 mg total) by mouth 2 (two) times daily as needed for muscle spasms. 60 tablet 0   norgestimate-ethinyl estradiol (ORTHO-CYCLEN) 0.25-35 MG-MCG tablet Take 1 tablet by mouth daily.     propranolol  (INDERAL ) 10 MG tablet TAKE 1 TABLET BY MOUTH THREE TIMES A DAY 90 tablet 0   sertraline  (ZOLOFT ) 50 MG tablet TAKE 1 TABLET BY MOUTH EVERY DAY 30 tablet 0   No current facility-administered medications for this visit.    Medication Side Effects: None  Allergies:  Allergies  Allergen Reactions   Penicillin G Other (See Comments)    Childhood reaction  Unknown   Amoxicillin Rash    Past Medical History:  Diagnosis Date   Acute bilateral thoracic back pain 01/27/2022   Allergic conjunctivitis of left eye 01/02/2017   Chronic bilateral low back pain without sciatica 07/16/2021   DJD (degenerative joint disease)    Scoliosis     Family History  Problem Relation Age of Onset  Cancer Maternal Aunt    Diabetes Maternal Grandmother    Heart disease Maternal Grandmother    Cancer Maternal Grandmother    Hypertension Maternal Grandfather     Social History   Socioeconomic History   Marital status: Single    Spouse name: Not on file   Number of children: Not on file   Years of education: Not on file   Highest education level:  Not on file  Occupational History   Not on file  Tobacco Use   Smoking status: Never   Smokeless tobacco: Never  Substance and Sexual Activity   Alcohol use: Never   Drug use: Never   Sexual activity: Not on file  Other Topics Concern   Not on file  Social History Narrative   Not on file   Social Drivers of Health   Financial Resource Strain: Not on file  Food Insecurity: Low Risk  (02/21/2024)   Received from [facility]   Hunger Vital Sign    Within the past 12 months, you worried that your food would run out before you got money to buy more: Never true    Within the past 12 months, the food you bought just didn't last and you didn't have money to get more. : Never true  Transportation Needs: No Transportation Needs (02/21/2024)   Received from [facility]    In the past 12 months, has lack of reliable transportation kept you from medical appointments, meetings, work or from getting things needed for daily living? : No  Physical Activity: Not on file  Stress: Not on file  Social Connections: Not on file  Intimate Partner Violence: Not on file    Past Medical History, Surgical history, Social history, and Family history were reviewed and updated as appropriate.   Please see review of systems for further details on the patient's review from today.   Objective:   Physical Exam:  There were no vitals taken for this visit.  Physical Exam Constitutional:      General: She is not in acute distress. Musculoskeletal:        General: No deformity.  Neurological:     Mental Status: She is alert and oriented to person, place, and time.     Coordination: Coordination normal.  Psychiatric:        Attention and Perception: Attention and perception normal. She does not perceive auditory or visual hallucinations.        Mood and Affect: Mood normal. Mood is not anxious or depressed. Affect is not labile, blunt, angry or inappropriate.        Speech: Speech  normal.        Behavior: Behavior normal.        Thought Content: Thought content normal. Thought content is not paranoid or delusional. Thought content does not include homicidal or suicidal ideation. Thought content does not include homicidal or suicidal plan.        Cognition and Memory: Cognition and memory normal.        Judgment: Judgment normal.     Comments: Insight intact     Lab Review:     Component Value Date/Time   NA 137 06/09/2023 1824   K 3.3 (L) 06/09/2023 1824   CL 103 06/09/2023 1824   CO2 20 (L) 06/09/2023 1824   GLUCOSE 113 (H) 06/09/2023 1824   BUN 11 06/09/2023 1824   CREATININE 0.87 06/09/2023 1824   CALCIUM 9.8 06/09/2023 1824   PROT 7.3  06/09/2023 1824   ALBUMIN 4.3 06/09/2023 1824   AST 32 06/09/2023 1824   ALT 18 06/09/2023 1824   ALKPHOS 41 06/09/2023 1824   BILITOT 0.7 06/09/2023 1824   GFRNONAA >60 06/09/2023 1824       Component Value Date/Time   WBC 5.8 06/09/2023 1824   RBC 4.69 06/09/2023 1824   HGB 14.1 06/09/2023 1824   HCT 42.1 06/09/2023 1824   PLT 246 06/09/2023 1824   MCV 89.8 06/09/2023 1824   MCH 30.1 06/09/2023 1824   MCHC 33.5 06/09/2023 1824   RDW 12.5 06/09/2023 1824    No results found for: POCLITH, LITHIUM   No results found for: PHENYTOIN, PHENOBARB, VALPROATE, CBMZ   .res Assessment: Plan:    Plan:  PDMP reviewed  Propranolol  10mg  TID for anxiety  Zoloft  50mg  daily  Consider Hydroxyzine  RTC 3 months  10 minutes spent dedicated to the care of this patient on the date of this encounter to include pre-visit review of records, ordering of medication, post visit documentation, and face-to-face time with the patient discussing depression, anxiety, insomnia and obsessional thoughts. Discussed continuing current medication regimen.  Patient advised to contact office with any questions, adverse effects, or acute worsening in signs and symptoms.  There are no diagnoses linked to this encounter.    Please see After Visit Summary for patient specific instructions.  Future Appointments  Date Time Provider Department Center  03/30/2024  3:30 PM Niyonna Betsill Nattalie, NP [facility] None    No orders of the defined types were placed in this encounter.     -------------------------------

## 2024-06-29 ENCOUNTER — Telehealth: Admitting: Adult Health

## 2024-06-29 ENCOUNTER — Encounter: Payer: Self-pay | Admitting: Adult Health

## 2024-06-29 DIAGNOSIS — F411 Generalized anxiety disorder: Secondary | ICD-10-CM

## 2024-06-29 MED ORDER — SERTRALINE HCL 50 MG PO TABS: 50.0000 mg | ORAL_TABLET | Freq: Every day | ORAL | 1 refills | Status: AC

## 2024-06-29 NOTE — Progress Notes (Signed)
 Vanessa Washington [id number] July 23, 2000 24 y.o.  Virtual Visit via Video Note  I connected with pt @ on 06/29/2024 at  4:00 PM EST by a video enabled telemedicine application and verified that I am speaking with the correct person using two identifiers.   I discussed the limitations of evaluation and management by telemedicine and the availability of in person appointments. The patient expressed understanding and agreed to proceed.  I discussed the assessment and treatment plan with the patient. The patient was provided an opportunity to ask questions and all were answered. The patient agreed with the plan and demonstrated an understanding of the instructions.   The patient was advised to call back or seek an in-person evaluation if the symptoms worsen or if the condition fails to improve as anticipated.  I provided 10 minutes of non-face-to-face time during this encounter.  The patient was located at home.  The provider was located at [facility].   Angeline LOISE Sayers, NP   Subjective:   Patient ID:  Vanessa Washington is a 24 y.o. (DOB 01/13/2001) female.  Chief Complaint: No chief complaint on file.   HPI Vanessa Washington presents for follow-up of GAD.  Describes mood today as ok. Pleasant. Denies tearfulness. Mood symptoms - denies anxiety, depression and irritability. Reports stable interest and motivation. Denies panic attacks. Denies worry, over thinking and rumination. Reports decreased OCD behaviors. Denies irrational and intrusive thoughts. Reports mood is stable. Stating I feel like I'm doing alright. Feels like the Zoloft  continues to work well. Taking medications as prescribed.  Energy levels stable. Active, has a regular exercise routine.  Enjoys some usual interests and activities. Single. Lives with her mom, grandmother and 2 brothers - 2 dogs and an outdoor cat. Spending time with family. Appetite adequate. Weight gain 200 pounds - 70. Sleeps well  most nights. Averages 8 hours. Focus and concentration stable. Completing tasks. Managing aspects of household. Working at [employer] Denies SI or HI.  Denies AH or VH. Denies self harm. Denies substance use.  Review of Systems:  Review of Systems  Musculoskeletal:  Negative for gait problem.  Neurological:  Negative for tremors.  Psychiatric/Behavioral:         Please refer to HPI    Medications: I have reviewed the patient's current medications.  Current Outpatient Medications  Medication Sig Dispense Refill   cetirizine (ZYRTEC) 10 MG tablet Take 10 mg by mouth daily.     levothyroxine (SYNTHROID) 150 MCG tablet Take 150 mcg by mouth daily before breakfast.     methocarbamol  (ROBAXIN ) 500 MG tablet Take 1 tablet (500 mg total) by mouth 2 (two) times daily as needed for muscle spasms. 60 tablet 0   norgestimate-ethinyl estradiol (ORTHO-CYCLEN) 0.25-35 MG-MCG tablet Take 1 tablet by mouth daily.     propranolol  (INDERAL ) 10 MG tablet TAKE 1 TABLET BY MOUTH THREE TIMES A DAY 90 tablet 0   sertraline  (ZOLOFT ) 50 MG tablet Take 1 tablet (50 mg total) by mouth daily. 90 tablet 1   No current facility-administered medications for this visit.    Medication Side Effects: None  Allergies: Allergies[1]  Past Medical History:  Diagnosis Date   Acute bilateral thoracic back pain 01/27/2022   Allergic conjunctivitis of left eye 01/02/2017   Chronic bilateral low back pain without sciatica 07/16/2021   DJD (degenerative joint disease)    Scoliosis     Family History  Problem Relation Age of Onset   Cancer Maternal Aunt    Diabetes Maternal Grandmother  Heart disease Maternal Grandmother    Cancer Maternal Grandmother    Hypertension Maternal Grandfather     Social History   Socioeconomic History   Marital status: Single    Spouse name: Not on file   Number of children: Not on file   Years of education: Not on file   Highest education level: Not on file  Occupational  History   Not on file  Tobacco Use   Smoking status: Never   Smokeless tobacco: Never  Substance and Sexual Activity   Alcohol use: Never   Drug use: Never   Sexual activity: Not on file  Other Topics Concern   Not on file  Social History Narrative   Not on file   Social Drivers of Health   Tobacco Use: Low Risk (05/01/2024)   Received from [facility]   Patient History    Smoking Tobacco Use: Never    Smokeless Tobacco Use: Never    Passive Exposure: Not on file  Financial Resource Strain: Not on file  Food Insecurity: Low Risk (02/21/2024)   Received from [facility]   Epic    Within the past 12 months, you worried that your food would run out before you got money to buy more: Never true    Within the past 12 months, the food you bought just didn't last and you didn't have money to get more. : Never true  Transportation Needs: No Transportation Needs (02/21/2024)   Received from [facility]    In the past 12 months, has lack of reliable transportation kept you from medical appointments, meetings, work or from getting things needed for daily living? : No  Physical Activity: Not on file  Stress: Not on file  Social Connections: Not on file  Intimate Partner Violence: Not on file  Depression (EYV7-0): Not on file  Alcohol Screen: Not on file  Housing: Low Risk (02/21/2024)   Received from [facility]   Epic    What is your living situation today?: I have a steady place to live    Think about the place you live. Do you have problems with any of the following? Choose all that apply:: None/None on this list  Utilities: Low Risk (02/21/2024)   Received from [facility]   Utilities    In the past 12 months has the electric, gas, oil, or water company threatened to shut off services in your home? : No  Health Literacy: Not on file    Past Medical History, Surgical history, Social history, and Family history were reviewed and updated as appropriate.    Please see review of systems for further details on the patient's review from today.   Objective:   Physical Exam:  There were no vitals taken for this visit.  Physical Exam Constitutional:      General: She is not in acute distress. Musculoskeletal:        General: No deformity.  Neurological:     Mental Status: She is alert and oriented to person, place, and time.     Coordination: Coordination normal.  Psychiatric:        Attention and Perception: Attention and perception normal. She does not perceive auditory or visual hallucinations.        Mood and Affect: Mood normal. Mood is not anxious or depressed. Affect is not labile, blunt, angry or inappropriate.        Speech: Speech normal.        Behavior: Behavior  normal.        Thought Content: Thought content normal. Thought content is not paranoid or delusional. Thought content does not include homicidal or suicidal ideation. Thought content does not include homicidal or suicidal plan.        Cognition and Memory: Cognition and memory normal.        Judgment: Judgment normal.     Comments: Insight intact     Lab Review:     Component Value Date/Time   NA 137 06/09/2023 1824   K 3.3 (L) 06/09/2023 1824   CL 103 06/09/2023 1824   CO2 20 (L) 06/09/2023 1824   GLUCOSE 113 (H) 06/09/2023 1824   BUN 11 06/09/2023 1824   CREATININE 0.87 06/09/2023 1824   CALCIUM 9.8 06/09/2023 1824   PROT 7.3 06/09/2023 1824   ALBUMIN 4.3 06/09/2023 1824   AST 32 06/09/2023 1824   ALT 18 06/09/2023 1824   ALKPHOS 41 06/09/2023 1824   BILITOT 0.7 06/09/2023 1824   GFRNONAA >60 06/09/2023 1824       Component Value Date/Time   WBC 5.8 06/09/2023 1824   RBC 4.69 06/09/2023 1824   HGB 14.1 06/09/2023 1824   HCT 42.1 06/09/2023 1824   PLT 246 06/09/2023 1824   MCV 89.8 06/09/2023 1824   MCH 30.1 06/09/2023 1824   MCHC 33.5 06/09/2023 1824   RDW 12.5 06/09/2023 1824    No results found for: POCLITH, LITHIUM   No results  found for: PHENYTOIN, PHENOBARB, VALPROATE, CBMZ   .res Assessment: Plan:    Plan:  PDMP reviewed  Continue: Zoloft  50mg  daily  D/C Propranolol  10mg  TID for anxiety D/C Hydroxyzine  RTC 6 months  10 minutes spent dedicated to the care of this patient on the date of this encounter to include pre-visit review of records, ordering of medication, post visit documentation, and face-to-face time with the patient discussing depression, anxiety, insomnia and obsessional thoughts. Discussed continuing current medication regimen.  Patient advised to contact office with any questions, adverse effects, or acute worsening in signs and symptoms. There are no diagnoses linked to this encounter.   Please see After Visit Summary for patient specific instructions.  Future Appointments  Date Time Provider Department Center  06/29/2024  4:00 PM Natilie Krabbenhoft Nattalie, NP [facility] None    No orders of the defined types were placed in this encounter.     -------------------------------     [1]  Allergies Allergen Reactions   Penicillin G Other (See Comments)    Childhood reaction  Unknown   Amoxicillin Rash
